# Patient Record
Sex: Female | Born: 1990 | Race: White | Hispanic: No | Marital: Single | Smoking: Never smoker
Health system: Southern US, Community
[De-identification: ages and names within clinical notes are randomized; demographics above are authoritative.]

## PROBLEM LIST (undated history)

## (undated) DIAGNOSIS — R519 Headache, unspecified: Secondary | ICD-10-CM

## (undated) DIAGNOSIS — R21 Rash and other nonspecific skin eruption: Secondary | ICD-10-CM

## (undated) DIAGNOSIS — F419 Anxiety disorder, unspecified: Secondary | ICD-10-CM

## (undated) DIAGNOSIS — R102 Pelvic and perineal pain: Secondary | ICD-10-CM

## (undated) DIAGNOSIS — T148XXA Other injury of unspecified body region, initial encounter: Secondary | ICD-10-CM

## (undated) DIAGNOSIS — I879 Disorder of vein, unspecified: Secondary | ICD-10-CM

## (undated) DIAGNOSIS — N83209 Unspecified ovarian cyst, unspecified side: Secondary | ICD-10-CM

## (undated) DIAGNOSIS — K219 Gastro-esophageal reflux disease without esophagitis: Secondary | ICD-10-CM

## (undated) DIAGNOSIS — H539 Unspecified visual disturbance: Secondary | ICD-10-CM

## (undated) DIAGNOSIS — F909 Attention-deficit hyperactivity disorder, unspecified type: Secondary | ICD-10-CM

## (undated) DIAGNOSIS — IMO0001 Reserved for inherently not codable concepts without codable children: Secondary | ICD-10-CM

## (undated) HISTORY — DX: Headache, unspecified: R51.9

## (undated) HISTORY — DX: Unspecified ovarian cyst, unspecified side: N83.209

## (undated) HISTORY — DX: Unspecified visual disturbance: H53.9

## (undated) HISTORY — DX: Rash and other nonspecific skin eruption: R21

## (undated) HISTORY — DX: Gastro-esophageal reflux disease without esophagitis: K21.9

## (undated) HISTORY — DX: Disorder of vein, unspecified: I87.9

## (undated) HISTORY — DX: Pelvic and perineal pain: R10.2

## (undated) HISTORY — DX: Other injury of unspecified body region, initial encounter: T14.8XXA

## (undated) HISTORY — DX: Attention-deficit hyperactivity disorder, unspecified type: F90.9

## (undated) HISTORY — DX: Anxiety disorder, unspecified: F41.9

## (undated) HISTORY — PX: WISDOM TOOTH EXTRACTION: SHX21

## (undated) HISTORY — PX: ENDOSCOPY UPPER GI: SHX510245

## (undated) HISTORY — PX: CERVICAL CONE BIOPSY: SUR198

## (undated) HISTORY — DX: Reserved for inherently not codable concepts without codable children: IMO0001

---
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
## 2012-01-25 ENCOUNTER — Observation Stay: Payer: Self-pay | Admitting: Internal Medicine

## 2012-01-25 LAB — COMPREHENSIVE METABOLIC PANEL
Albumin: 4.1 g/dL (ref 3.8–5.6)
Alkaline Phosphatase: 49 U/L — ABNORMAL LOW (ref 82–169)
Anion Gap: 7 (ref 7–16)
Bilirubin,Total: 0.6 mg/dL (ref 0.2–1.0)
Calcium, Total: 8.8 mg/dL — ABNORMAL LOW (ref 9.0–10.7)
Co2: 26 mmol/L (ref 21–32)
Glucose: 87 mg/dL (ref 65–99)
Osmolality: 275 (ref 275–301)
SGOT(AST): 23 U/L (ref 0–26)
SGPT (ALT): 17 U/L (ref 12–78)
Sodium: 139 mmol/L (ref 136–145)
Total Protein: 7.5 g/dL (ref 6.4–8.6)

## 2012-01-25 LAB — URINALYSIS, COMPLETE
Ketone: NEGATIVE
Leukocyte Esterase: NEGATIVE
Nitrite: NEGATIVE
Ph: 6 (ref 4.5–8.0)
Protein: NEGATIVE
RBC,UR: 3 /HPF (ref 0–5)
Squamous Epithelial: 13
WBC UR: 4 /HPF (ref 0–5)

## 2012-01-25 LAB — AMYLASE: Amylase: 22 U/L — ABNORMAL LOW (ref 25–115)

## 2012-01-25 LAB — CBC
HCT: 35.3 % (ref 35.0–47.0)
MCH: 30 pg (ref 26.0–34.0)
MCHC: 35.2 g/dL (ref 32.0–36.0)
MCV: 85 fL (ref 80–100)
Platelet: 304 10*3/uL (ref 150–440)
RDW: 12.9 % (ref 11.5–14.5)
WBC: 10.5 10*3/uL (ref 3.6–11.0)

## 2012-01-25 LAB — HEMOGLOBIN
HGB: 11.2 g/dL — ABNORMAL LOW (ref 12.0–16.0)
HGB: 11.8 g/dL — ABNORMAL LOW (ref 12.0–16.0)

## 2012-01-25 LAB — LIPASE, BLOOD: Lipase: 64 U/L — ABNORMAL LOW (ref 73–393)

## 2012-01-26 LAB — COMPREHENSIVE METABOLIC PANEL
Alkaline Phosphatase: 45 U/L — ABNORMAL LOW (ref 82–169)
Calcium, Total: 8.3 mg/dL — ABNORMAL LOW (ref 9.0–10.7)
Co2: 24 mmol/L (ref 21–32)
Creatinine: 0.74 mg/dL (ref 0.60–1.30)
EGFR (African American): >60
EGFR (Non-African Amer.): >60
Glucose: 71 mg/dL (ref 65–99)
SGOT(AST): 20 U/L (ref 0–26)
SGPT (ALT): 14 U/L (ref 12–78)
Sodium: 139 mmol/L (ref 136–145)

## 2012-01-26 LAB — CBC WITH DIFFERENTIAL/PLATELET
Eosinophil %: 3 %
HGB: 11.2 g/dL — ABNORMAL LOW (ref 12.0–16.0)
MCH: 30 pg (ref 26.0–34.0)
MCV: 85 fL (ref 80–100)
Monocyte %: 7.3 %
Neutrophil %: 58.8 %
Platelet: 230 10*3/uL (ref 150–440)
RBC: 3.74 10*6/uL — ABNORMAL LOW (ref 3.80–5.20)
RDW: 13 % (ref 11.5–14.5)
WBC: 9.6 10*3/uL (ref 3.6–11.0)

## 2013-04-07 ENCOUNTER — Emergency Department: Payer: Self-pay | Admitting: Emergency Medicine

## 2013-04-07 LAB — COMPREHENSIVE METABOLIC PANEL
ALBUMIN: 4 g/dL (ref 3.4–5.0)
ALT: 17 U/L (ref 12–78)
Alkaline Phosphatase: 52 U/L
Anion Gap: 4 — ABNORMAL LOW (ref 7–16)
BUN: 8 mg/dL (ref 7–18)
Bilirubin,Total: 0.7 mg/dL (ref 0.2–1.0)
CHLORIDE: 104 mmol/L (ref 98–107)
CO2: 28 mmol/L (ref 21–32)
Calcium, Total: 9.6 mg/dL (ref 8.5–10.1)
Creatinine: 0.65 mg/dL (ref 0.60–1.30)
EGFR (African American): >60
EGFR (Non-African Amer.): >60
Glucose: 82 mg/dL (ref 65–99)
Osmolality: 269 (ref 275–301)
Potassium: 3.8 mmol/L (ref 3.5–5.1)
SGOT(AST): 23 U/L (ref 15–37)
Sodium: 136 mmol/L (ref 136–145)
TOTAL PROTEIN: 7.8 g/dL (ref 6.4–8.2)

## 2013-04-07 LAB — CBC WITH DIFFERENTIAL/PLATELET
Basophil #: 0 10*3/uL (ref 0.0–0.1)
Basophil %: 0.6 %
EOS PCT: 0.8 %
Eosinophil #: 0.1 10*3/uL (ref 0.0–0.7)
HCT: 36.2 % (ref 35.0–47.0)
HGB: 12.4 g/dL (ref 12.0–16.0)
Lymphocyte #: 1.9 10*3/uL (ref 1.0–3.6)
Lymphocyte %: 24.1 %
MCH: 29.2 pg (ref 26.0–34.0)
MCHC: 34.3 g/dL (ref 32.0–36.0)
MCV: 85 fL (ref 80–100)
Monocyte #: 0.5 x10 3/mm (ref 0.2–0.9)
Monocyte %: 6.7 %
NEUTROS ABS: 5.4 10*3/uL (ref 1.4–6.5)
NEUTROS PCT: 67.8 %
Platelet: 280 10*3/uL (ref 150–440)
RBC: 4.25 10*6/uL (ref 3.80–5.20)
RDW: 15.8 % — ABNORMAL HIGH (ref 11.5–14.5)
WBC: 8 10*3/uL (ref 3.6–11.0)

## 2013-04-07 LAB — URINALYSIS, COMPLETE
BACTERIA: NONE SEEN
Bilirubin,UR: NEGATIVE
GLUCOSE, UR: NEGATIVE mg/dL (ref 0–75)
KETONE: NEGATIVE
LEUKOCYTE ESTERASE: NEGATIVE
NITRITE: NEGATIVE
Ph: 5 (ref 4.5–8.0)
Protein: NEGATIVE
Specific Gravity: 1.015 (ref 1.003–1.030)
Squamous Epithelial: 1
WBC UR: 1 /HPF (ref 0–5)

## 2013-04-07 LAB — HCG, QUANTITATIVE, PREGNANCY

## 2013-09-29 NOTE — H&P (Signed)
Madeline Anderson is an 23 y.o. 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  She also has a [place] IUD present.    No past medical history on file.    Allergies:   Allergies   Allergen Reactions   . Ciprofloxacin    . Shellfish-Derived Products        Active Problems:    * No active hospital problems. *    There were no vitals taken for this visit.    Review of Systems   Constitutional: Negative.    HENT: Negative.    Respiratory: Negative.    Cardiovascular: Negative.    Gastrointestinal: Negative.    Genitourinary: Negative.    Musculoskeletal: Negative.    Skin: Negative.    Neurological: Negative.    Endo/Heme/Allergies: Negative.    Psychiatric/Behavioral: Negative.        Physical Exam   Constitutional: She is oriented to person, place, and time.   HENT:   Head: Normocephalic and atraumatic.   Eyes: Pupils are equal, round, and reactive to light.   Neck: No thyromegaly present.   Cardiovascular: Normal rate, regular rhythm and normal heart sounds.    Pulmonary/Chest: Effort normal and breath sounds normal.   Abdominal: Soft. Bowel sounds are normal.   Genitourinary: Vagina normal and uterus normal.   Musculoskeletal: Normal range of motion.   Lymphadenopathy:     She has no cervical adenopathy.   Neurological: She is alert and oriented to person, place, and time.       Assessment:  CIN 2, IUD    Plan:  We plan to do a cervical cone biopsy via LEEP and an ECC.  I will have to remove the IUD and will send it for actinomyces culture.  She understands the procedure, risks, benefits and alternatives and verbal consent is obtained.    Demetra Shiner  09/29/2013

## 2013-09-30 ENCOUNTER — Ambulatory Visit (HOSPITAL_BASED_OUTPATIENT_CLINIC_OR_DEPARTMENT_OTHER): Payer: No Typology Code available for payment source | Admitting: Anesthesiology

## 2013-09-30 ENCOUNTER — Ambulatory Visit (HOSPITAL_BASED_OUTPATIENT_CLINIC_OR_DEPARTMENT_OTHER): Payer: No Typology Code available for payment source | Admitting: Gynecology

## 2013-09-30 ENCOUNTER — Ambulatory Visit: Payer: Self-pay

## 2013-09-30 ENCOUNTER — Ambulatory Visit
Admission: RE | Admit: 2013-09-30 | Discharge: 2013-09-30 | Disposition: A | Discharge status: Home / Self Care | Payer: No Typology Code available for payment source | Source: Ambulatory Visit | Attending: Gynecology | Admitting: Gynecology

## 2013-09-30 ENCOUNTER — Encounter (HOSPITAL_BASED_OUTPATIENT_CLINIC_OR_DEPARTMENT_OTHER)
Admission: RE | Disposition: A | Discharge status: Home / Self Care | Payer: Self-pay | Source: Ambulatory Visit | Attending: Gynecology

## 2013-09-30 ENCOUNTER — Encounter (HOSPITAL_BASED_OUTPATIENT_CLINIC_OR_DEPARTMENT_OTHER): Payer: Self-pay

## 2013-09-30 DIAGNOSIS — N871 Moderate cervical dysplasia: Secondary | ICD-10-CM | POA: Insufficient documentation

## 2013-09-30 DIAGNOSIS — Z30432 Encounter for removal of intrauterine contraceptive device: Secondary | ICD-10-CM | POA: Insufficient documentation

## 2013-09-30 HISTORY — PX: EXCISION, CERVIX LESION, LEEP PROCEDURE: SHX3954

## 2013-09-30 LAB — POCT PREGNANCY TEST, URINE HCG: POCT Pregnancy HCG Test, UR: NEGATIVE

## 2013-09-30 SURGERY — EXCISION, CERVIX LESION, LEEP PROCEDURE
Anesthesia: Anesthesia General | Site: Pelvis | Wound class: Clean Contaminated

## 2013-09-30 MED ORDER — LACTATED RINGERS IV SOLN
INTRAVENOUS | Status: DC
Start: 2013-09-30 — End: 2013-09-30

## 2013-09-30 MED ORDER — ONDANSETRON HCL 4 MG/2ML IJ SOLN
4.0000 mg | Freq: Once | INTRAMUSCULAR | Status: DC | PRN
Start: 2013-09-30 — End: 2013-09-30

## 2013-09-30 MED ORDER — EPHEDRINE SULFATE 50 MG/ML IJ SOLN
INTRAMUSCULAR | Status: DC | PRN
Start: 2013-09-30 — End: 2013-09-30
  Administered 2013-09-30: 12:00:00 10 mg via INTRAVENOUS

## 2013-09-30 MED ORDER — EPHEDRINE SULFATE 50 MG/ML IJ SOLN
INTRAMUSCULAR | Status: AC
Start: 2013-09-30 — End: ?
  Filled 2013-09-30: qty 1

## 2013-09-30 MED ORDER — HYDROMORPHONE HCL PF 1 MG/ML IJ SOLN
0.5000 mg | INTRAMUSCULAR | Status: DC | PRN
Start: 2013-09-30 — End: 2013-09-30

## 2013-09-30 MED ORDER — OXYCODONE-ACETAMINOPHEN 5-325 MG PO TABS
1.0000 | ORAL_TABLET | Freq: Once | ORAL | Status: AC | PRN
Start: 2013-09-30 — End: 2013-09-30
  Administered 2013-09-30: 1 via ORAL

## 2013-09-30 MED ORDER — LIDOCAINE HCL 2 % IJ SOLN
INTRAMUSCULAR | Status: DC | PRN
Start: 2013-09-30 — End: 2013-09-30
  Administered 2013-09-30: 50 mg

## 2013-09-30 MED ORDER — FENTANYL CITRATE 0.05 MG/ML IJ SOLN
INTRAMUSCULAR | Status: AC
Start: 2013-09-30 — End: ?
  Filled 2013-09-30: qty 2

## 2013-09-30 MED ORDER — ACETIC ACID 5% 30 ML VIAL
Status: DC | PRN
Start: 2013-09-30 — End: 2013-09-30
  Administered 2013-09-30: 30 mL

## 2013-09-30 MED ORDER — OXYCODONE-ACETAMINOPHEN 5-325 MG PO TABS
ORAL_TABLET | ORAL | Status: AC
Start: 2013-09-30 — End: ?
  Filled 2013-09-30: qty 1

## 2013-09-30 MED ORDER — IODINE STRONG 5 % PO SOLN
ORAL | Status: DC | PRN
Start: 2013-09-30 — End: 2013-09-30
  Administered 2013-09-30: 0.2 mL via TOPICAL

## 2013-09-30 MED ORDER — MIDAZOLAM HCL 2 MG/2ML IJ SOLN
INTRAMUSCULAR | Status: DC | PRN
Start: 2013-09-30 — End: 2013-09-30
  Administered 2013-09-30: 2 mg via INTRAVENOUS

## 2013-09-30 MED ORDER — ACETIC ACID 5% 30 ML VIAL
Status: DC
Start: 2013-09-30 — End: 2013-09-30
  Filled 2013-09-30: qty 30

## 2013-09-30 MED ORDER — IODINE STRONG 5 % PO SOLN
ORAL | Status: DC
Start: 2013-09-30 — End: 2013-09-30
  Filled 2013-09-30: qty 473

## 2013-09-30 MED ORDER — PROPOFOL INFUSION 10 MG/ML
INTRAVENOUS | Status: DC | PRN
Start: 2013-09-30 — End: 2013-09-30
  Administered 2013-09-30: 40 mg via INTRAVENOUS
  Administered 2013-09-30: 10 mg via INTRAVENOUS

## 2013-09-30 MED ORDER — FENTANYL CITRATE 0.05 MG/ML IJ SOLN
INTRAMUSCULAR | Status: DC | PRN
Start: 2013-09-30 — End: 2013-09-30
  Administered 2013-09-30 (×3): 25 ug via INTRAVENOUS

## 2013-09-30 MED ORDER — METOCLOPRAMIDE HCL 5 MG/ML IJ SOLN
10.0000 mg | Freq: Once | INTRAMUSCULAR | Status: DC | PRN
Start: 2013-09-30 — End: 2013-09-30

## 2013-09-30 MED ORDER — FERRIC SUBSULFATE 259 MG/GM EX SOLN
CUTANEOUS | Status: DC
Start: 2013-09-30 — End: 2013-09-30
  Filled 2013-09-30: qty 8000

## 2013-09-30 MED ORDER — PROPOFOL 10 MG/ML IV EMUL
INTRAVENOUS | Status: AC
Start: 2013-09-30 — End: ?
  Filled 2013-09-30: qty 50

## 2013-09-30 MED ORDER — SODIUM CHLORIDE 0.9 % IR SOLN
Status: DC | PRN
Start: 2013-09-30 — End: 2013-09-30
  Administered 2013-09-30: 1000 mL

## 2013-09-30 MED ORDER — FERRIC SUBSULFATE 259 MG/GM EX SOLN
CUTANEOUS | Status: DC | PRN
Start: 2013-09-30 — End: 2013-09-30
  Administered 2013-09-30: 8 g via TOPICAL

## 2013-09-30 MED ORDER — MIDAZOLAM HCL 2 MG/2ML IJ SOLN
INTRAMUSCULAR | Status: AC
Start: 2013-09-30 — End: ?
  Filled 2013-09-30: qty 2

## 2013-09-30 MED ORDER — FENTANYL CITRATE 0.05 MG/ML IJ SOLN
25.0000 ug | INTRAMUSCULAR | Status: DC | PRN
Start: 2013-09-30 — End: 2013-09-30

## 2013-09-30 MED ORDER — PROPOFOL INFUSION 10 MG/ML
INTRAVENOUS | Status: DC | PRN
Start: 2013-09-30 — End: 2013-09-30
  Administered 2013-09-30: 120 ug/kg/min via INTRAVENOUS

## 2013-09-30 SURGICAL SUPPLY — 18 items
DEPRESSOR TONGUE JR BIRCHWOOD (Procedure Accessories) IMPLANT
ELECTRODE LLETZ BALL 5MM (Cautery) ×1 IMPLANT
ELECTRODE LLETZ LOOP 10 X 10MM (Cautery) ×1 IMPLANT
ELECTRODE LLETZ LOOP 15 X 12MM (Cautery) IMPLANT
ELECTRODE LLETZ LOOP 20 X 15MM (Cautery) IMPLANT
GLOVE SRG NTR RBR 7 BGL IND 288X91MM LTX (Glove) ×1
GLOVE SURG BIOGEL INDIC SZ 7.0 (Glove) ×1
GLOVE SURG BIOGEL PF LTX SZ7.0 (Glove) ×2 IMPLANT
GLOVE SURGICAL 7 BIOGEL INDICATOR POWDER (Glove) ×1 IMPLANT
GLOVE SURGICAL 7 BIOGEL INDICATOR POWDER FREE SMOOTH BEAD CUFF (Glove) ×1 IMPLANT
HOSE EVAC SMOKE 7/8INX6FT (Suction) ×2 IMPLANT
INACTIVE USE LAWSON 93583 (Gown) ×2 IMPLANT
PACK LITHOTOMY MINOR (Pack) ×2 IMPLANT
PAD ELECTROSRG GRND REM W CRD (Procedure Accessories) ×2 IMPLANT
PAD SANITARY L12.25 IN X W4.25 IN HEAVY ABSORBENT MOISTURE BARRIER (Dressing) IMPLANT
PAD SNTR SLK FLF CRTY 12.25X4.25IN LF NS (Dressing) ×1 IMPLANT
PAD-PERI SANITARY REGULAR (Dressing) ×1
PENCIL ELECTRO PUSH BUTTON (Cautery) ×2 IMPLANT

## 2013-09-30 NOTE — Brief Op Note (Signed)
{  BRIEF OP NOTE CHOICES:2BRIEF OP NOTE    Date Time: 09/30/2013 11:51 AM    Patient Name:   Madeline Anderson    Date of Operation:   09/30/2013    Providers Performing:   Surgeon(s):  Demetra Shiner, MD    Assistant (s):   Scrub Person: Dioquino, Ardyth Gal, RN  Preceptor: Quincy Simmonds, RN; Toya Smothers, RN    Operative Procedure:   Procedure(s):  LEEP & ECC    Preoperative Diagnosis:   Pre-Op Diagnosis Codes:     * Moderate dysplasia of cervix [622.12]    Postoperative Diagnosis:   Post-Op Diagnosis Codes:     * Moderate dysplasia of cervix [622.12]    Anesthesia:   IV Sedation    Estimated Blood Loss:    * No values recorded between 09/30/2013 11:42 AM and 09/30/2013 11:51 AM *    Implants:   * No implants in log *    Drains:   Drains: no    Specimens:       Findings:   Small area of non staining    Complications:   None      Signed by: Demetra Shiner, MD                                                                           [facility]

## 2013-09-30 NOTE — Interval H&P Note (Signed)
No change since dictation.    Madeline Sacramento, MD

## 2013-09-30 NOTE — Anesthesia Preprocedure Evaluation (Signed)
Anesthesia Evaluation    AIRWAY    Mallampati: II    TM distance: <3 FB       CARDIOVASCULAR    cardiovascular exam normal       DENTAL         PULMONARY    pulmonary exam normal     OTHER FINDINGS                      Anesthesia Plan    ASA 2     general                     intravenous induction   Detailed anesthesia plan: general IV        Post op pain management: per surgeon        Plan discussed with CRNA.

## 2013-09-30 NOTE — Transfer of Care (Signed)
Anesthesia Transfer of Care Note    Patient: Madeline Anderson    Procedures performed: Procedure(s):  LEEP & ECC    Anesthesia type: General TIVA    Patient location:Gyn PACU    Last vitals:   Filed Vitals:    09/30/13 1159   BP: 103/59   Pulse: 72   Temp: 36.6 C (97.9 F)   Resp: 12   SpO2: 100%       Post pain: Patient not complaining of pain, continue current therapy      Mental Status:awake    Respiratory Function: tolerating room air    Cardiovascular: stable    Nausea/Vomiting: patient not complaining of nausea or vomiting    Hydration Status: adequate    Post assessment: no apparent anesthetic complications and no reportable events

## 2013-09-30 NOTE — Anesthesia Postprocedure Evaluation (Signed)
Anesthesia Post Evaluation    Patient: Madeline Anderson    Procedures performed: Procedure(s):  LEEP & ECC    Anesthesia type: General TIVA    Patient location:Phase II PACU    Last vitals:   Filed Vitals:    09/30/13 1240   BP: 108/60   Pulse: 69   Temp: 36.7 C (98 F)   Resp: 12   SpO2: 97%       Post pain: Patient not complaining of pain, continue current therapy      Mental Status:awake    Respiratory Function: tolerating room air    Cardiovascular: stable    Nausea/Vomiting: patient not complaining of nausea or vomiting    Hydration Status: adequate    Post assessment: no apparent anesthetic complications

## 2013-09-30 NOTE — Discharge Instructions (Signed)
LEEP  LEEP stands for loop electrosurgical excision procedure. It is used to treat dysplasia (abnormal cell growth). A fine wire loop is used to remove a small amount of tissue from your cervix. This can be done in the doctor's office. You can go back to your routine the same day. Schedule your LEEP for a time when you are not menstruating.  During the Procedure  You'll place your feet in stirrups. Your doctor then inserts a speculum into your vagina. The speculum holds the walls of the vagina open to let the doctor see the cervix:   Your cervix is numbed with a local anesthetic.   A mild vinegar or iodine solution is applied to your cervix. This helps to highlight any dysplasia.   Your doctor may look through a colposcope. This helps him or her to get a close-up view of your cervix.   The loop is inserted through your vagina and moved toward the cervix.   The loop is used to remove a small piece of cervical tissue.   A medicated solution may be applied to the cervix. This helps reduce bleeding.     The loop is inserted.       Tissue is removed from the cervix.       Medication is applied to reduce bleeding.      After the Procedure  You will be able to lie on the table until you are ready to stand up. You can then go back to your normal routine. The solution may cause a dark vaginal discharge for a few days after the procedure. Your cervix should heal completely within a few weeks.  When to Call Your Doctor  Call your doctor right away if you have any of the following:   Heavy bleeding or bleeding with clots   Severe abdominal pain   Fever   Foul-smelling discharge    2000-2014 [employer] [address], [place] [address]. All rights reserved. This information is not intended as a substitute for professional medical care. Always follow your healthcare professional's instructions.

## 2013-10-01 ENCOUNTER — Encounter (HOSPITAL_BASED_OUTPATIENT_CLINIC_OR_DEPARTMENT_OTHER): Payer: Self-pay | Admitting: Gynecology

## 2013-10-02 LAB — LAB USE ONLY - HISTORICAL SURGICAL PATHOLOGY

## 2013-10-06 NOTE — Op Note (Signed)
Procedure Date: 09/30/2013     Patient Type: A     SURGEON: Demetra Shiner MD  ASSISTANT:       PREOPERATIVE DIAGNOSIS:  Cervical intraepithelial neoplasia II and presence of Skyla intrauterine  device.     POSTOPERATIVE DIAGNOSIS:   Same     TITLE OF PROCEDURE:  Removal of the intrauterine device and cervical cone biopsy via LEEP and  endocervical curettage.       ANESTHESIA:   IV sedation.     FLUIDS:  The patient received a liter of fluid.     ESTIMATED BLOOD LOSS:  Minimal.     DESCRIPTION OF PROCEDURE:  The patient was taken to the operating room, placed in the dorsal lithotomy  position, and no preparation was performed.  She was draped with sterile  drapes, and a plastic-coated speculum was placed in the patient's vagina  and the cervix visualized.  The strings of the IUD were visible, and the  IUD was removed and submitted for actinomyces culture.  The cervix was then  cleaned with dilute acetic acid solution to dissolve away the cervical  mucous and then stained with Lugol's.  There was a small area of  nonstaining around the cervical os, and using a small wire loop, a cone was  performed without difficulty and submitted to pathology.  The entire  transformation zone had been removed, and then an ECC was performed above  the cone.  Hemostasis was obtained using a large ball electrode, and the  power settings were 50/50 blend 1.  Hemostasis was obtained and the base of  the cone swabbed with Monsel's paste, and the instruments were then removed  from the patient's vagina.  The final sponge and instrument counts were  taken and found to be correct, and she was returned to the postoperative  recovery room with an IV running.  There were no complications.           D:  10/06/2013 09:08 AM by Dr. Demetra Shiner, MD [phone])  T:  10/06/2013 18:33 PM by [place]      Everlean Cherry: [id number]) (Doc ID: [id number])

## 2013-11-12 ENCOUNTER — Emergency Department: Payer: Self-pay | Admitting: Emergency Medicine

## 2014-05-07 ENCOUNTER — Encounter: Payer: Self-pay | Admitting: Podiatry

## 2014-05-07 ENCOUNTER — Ambulatory Visit (INDEPENDENT_AMBULATORY_CARE_PROVIDER_SITE_OTHER): Payer: 59 | Admitting: Podiatry

## 2014-05-07 ENCOUNTER — Ambulatory Visit (INDEPENDENT_AMBULATORY_CARE_PROVIDER_SITE_OTHER): Payer: Commercial Managed Care - PPO

## 2014-05-07 VITALS — BP 108/81 | HR 71 | Resp 16 | Ht 67.0 in | Wt 117.0 lb

## 2014-05-07 DIAGNOSIS — M6740 Ganglion, unspecified site: Secondary | ICD-10-CM

## 2014-05-07 NOTE — Progress Notes (Signed)
   Subjective:    Patient ID: Felicia Knight, female    DOB: January 05, 1991, 24 y.o.   MRN: [id number]  HPI  24 year old female presents the office they with complaints of painful cyst on top of her right foot which has been ongoing for approximate 6 days his been progressively getting larger. She states that she has pain particularly was shoes as it causes pressure overlying the area. She states that the mass is soft. She stated the proximal A1 month ago she had an injury in which someone stepped on her foot. After that time she had some bruising to the foot and along the same area however she did not seek treatment and the pain resolved on its own. No other complaints at this time.  Review of Systems  All other systems reviewed and are negative.      Objective:   Physical Exam AAO x3, NAD DP/PT pulses palpable bilaterally, CRT less than 3 seconds Protective sensation intact with Simms Weinstein monofilament, vibratory sensation intact, Achilles tendon reflex intact On the dorsal aspect of the right foot on the third metatarsal area there is a small 1 x 1 cm fluid filled encapsulated soft tissue mass with well-defined borders. There is no overlying skin changes. There is tenderness upon direct palpation of this mass. There is no other areas of specific pinpoint bony tenderness or pain with vibratory sensation. No overlying edema, erythema, increase in warmth. No other lesions are identified bilaterally. MMT 5/5, ROM WNL No open lesions or pre-ulcerative lesions are identified. No pain with calf compression, swelling, warmth, erythema.        Assessment & Plan:  24 year old female right foot ganglion cyst -X-rays were obtained and reviewed with the patient. -Treatment options were discussed include alternatives, risks, complications. -Discussed aspiration of the cyst for which she like to proceed. Risks and complications were discussed and she understands and verbally consents. Under  sterile conditions a total of 1 mL mixture of 2% lidocaine plain and 0.5% Marcaine plain was infiltrated in a regional block fashion around the soft tissue mass. Once anesthetized the skin was prepped in a sterile fashion. An 18-gauge needle was utilized to aspirate the soft tissue mass and cleared gel-like fluid was aspirated. After aspiration the soft tissue mass was significantly improved in size. 0.5 mL of dexamethasone phosphate was infiltrated into the symptomatic area. A Band-Aid was applied followed by a Compression dressing. Post procedure care was discussed with the patient. -Follow-up in 2 weeks or sooner if any palms are to arise. In the meantime occurs call the office with any questions, concerns, changes symptoms.

## 2014-05-21 ENCOUNTER — Ambulatory Visit: Payer: Commercial Managed Care - PPO | Admitting: Podiatry

## 2014-06-09 NOTE — Consult Note (Signed)
Pt with epigastric, lower substernal discomfort, no vomiting in 2 days.  EGD with gastritis. Wants to go home with mom who is a Armed forces operational officerdermatologist near [place].  Discussed med treatment with PPI and carafate, await bx for H pylori.  I talked to hospitalist who will see patient and likely discharge.  Electronic Signatures: Scot JunElliott, Jonah Nestle T (MD)  (Signed on 07-Dec-13 10:03)  Authored  Last Updated: 07-Dec-13 10:03 by Scot JunElliott, Makyia Erxleben T (MD)

## 2014-06-09 NOTE — Consult Note (Signed)
Chief Complaint:   Subjective/Chief Complaint EGd unremarkable except mild gastritis. Patient vomited again after meal this afternoon and c/o epigastric pain again. No hemetemesis.   VITAL SIGNS/ANCILLARY NOTES: **Vital Signs.:   05-Dec-13 15:51   Temperature Temperature (F) 98.1   Celsius 36.7   Temperature Source oral   Pulse Pulse 62   Respirations Respirations 18   Systolic BP Systolic BP 110   Diastolic BP (mmHg) Diastolic BP (mmHg) 73   Mean BP 85   Pulse Ox % Pulse Ox % 97   Pulse Ox Activity Level  At rest   Oxygen Delivery Room Air/ 21 %   Assessment/Plan:  Assessment/Plan:   Assessment [place], vomiting and abdominal pain. EGD unremarkable as well as lipase. No hematemesis.    Plan Will obtain Amylase. Change to clear liquids. US abdomen in am. Further recommendations to follow.   Electronic Signatures: Lurline DelIftikhar, Mahreen Schewe (MD)  (Signed 05-Dec-13 18:56)  Authored: Chief Complaint, VITAL SIGNS/ANCILLARY NOTES, Assessment/Plan   Last Updated: 05-Dec-13 18:56 by Lurline DelIftikhar, Tennie Grussing (MD)

## 2014-06-09 NOTE — Consult Note (Signed)
PATIENT NAME:  Felicia Knight, Felicia Knight MR#:  [id number] DATE OF BIRTH:  1990-09-14  DATE OF CONSULTATION:  01/25/2012  REFERRING PHYSICIAN:   CONSULTING PHYSICIAN:  Lurline DelShaukat Trace Cederberg, MD  REASON FOR CONSULTATION:  Abdominal pain, vomiting, and coffee-ground emesis.  HISTORY OF PRESENT ILLNESS: 24 year old female without significant past medical history. The patient came to the Emergency Room today. Apparently the patient has been having problems with vomiting, usually after meals, for the last two weeks. The vomiting has become more severe within the last few days along with epigastric pain. Yesterday she noticed coffee-ground material on 2 to 3 occasions in the vomitus. She got quite concerned and came to the Emergency Room and was admitted early this morning. Apparently she has been using Nexium, Pepcid, ranitidine, and TUMS as an outpatient without significant relief. She [place] an [place]. She denies any lower GI symptoms such as rectal bleeding. She had a normal bowel movement and according to her it was not dark or bloody. When evaluated this morning she was also complaining of burning retrosternal chest pain along with some epigastric discomfort. No fever or chills. Denies any other significant problems and no history of nonsteroidal use.   PAST MEDICAL HISTORY: None.   FAMILY HISTORY: Unremarkable.   ALLERGIES: Shellfish. The patient is on a gluten-free diet according to her, although not sure if the diagnosis of celiac disease has ever been confirmed or not.   HOME MEDICATIONS: Birth control pills, Adderall, and Nexium and TUMS as mentioned above.   REVIEW OF SYSTEMS: Negative except for what is mentioned in the History of Present Illness.   PHYSICAL EXAMINATION:  GENERAL: Well built female, does not appear to be in distress. Does not appear to be toxic or septic.   VITAL SIGNS: Temperature 98.1, pulse 62, respirations 18, blood  pressure 110/73.   HEENT: Unremarkable. No jaundice was noted.   NECK: Veins are flat.   LUNGS: Clear to auscultation bilaterally.   CARDIOVASCULAR: Regular rate and rhythm. No gallops or murmur.   ABDOMEN: Normal bowel sounds. Minimal epigastric tenderness. There is no rebound or guarding. No ascites. No hepatosplenomegaly.   NEUROLOGIC: Examination appears to be unremarkable.   LABORATORY, DIAGNOSTIC, AND RADIOLOGICAL DATA: Serum lipase is normal at 64. Liver enzymes are normal as well as BUN and creatinine. Hemoglobin was 12.4 on admission, 11.2 a few hours after hydration. Urinalysis is unremarkable. Pregnancy test is negative. Chest and abdominal x-rays are unremarkable.   ASSESSMENT AND PLAN:  Patient with nausea, vomiting, retrosternal chest pain, epigastric pain, along with some coffee-ground emesis. The coffee-ground emesis may be secondary to gastritis or a Mallory-Weiss tear due to intermittent vomiting for a couple of weeks. Most likely she is suffering from an acid peptic disorder and therefore it was decided to perform an upper GI endoscopy. Upper GI endoscopy was done this afternoon which showed only minimal gastritis. No blood or bleeding was noted. No other lesions were noted. The patient continues to be symptomatic and other possibilities include gallbladder disease such as gallstones. Pancreatitis is less likely as serum lipase was normal. I will go ahead and obtain serum amylase. We will order an ultrasound for tomorrow morning. Continue on IV Protonix as well as Zofran and Mylanta for symptomatic relief.  The plan was discussed earlier with the patient's mother as well.  We will follow.   ____________________________ Lurline DelShaukat Martasia Talamante, MD si:bjt D: 01/25/2012 19:03:28 ET T: 01/26/2012 07:47:31 ET JOB#: [id number] Wynelle LinkSHAUKAT [id number]  MD ELECTRONICALLY SIGNED 01/29/2012 10:49

## 2014-06-09 NOTE — Consult Note (Signed)
Chief Complaint:   Subjective/Chief Complaint Still with abdominal pain. Seems to be tolerating liquids. No vomiting today. No hematemesis or melena.   VITAL SIGNS/ANCILLARY NOTES: **Vital Signs.:   06-Dec-13 05:08   Vital Signs Type Routine   Temperature Temperature (F) 97.9   Celsius 36.6   Temperature Source oral   Pulse Pulse 65   Respirations Respirations 18   Systolic BP Systolic BP 94   Diastolic BP (mmHg) Diastolic BP (mmHg) 57   Mean BP 69   Pulse Ox % Pulse Ox % 98   Pulse Ox Activity Level  At rest   Oxygen Delivery Room Air/ 21 %   Brief Assessment:   Additional Physical Exam Mild epigastric tenderness. Abdominal examination is otherwise benign.   Lab Results: Hepatic:  06-Dec-13 04:33    Bilirubin, Total 0.7   Alkaline Phosphatase  45   SGPT (ALT) 14   SGOT (AST) 20   Total Protein, Serum  5.8   Albumin, Serum  3.2  Routine Chem:  06-Dec-13 04:33    Glucose, Serum 71   BUN  6   Creatinine (comp) 0.74   Sodium, Serum 139   Potassium, Serum 4.0   Chloride, Serum 107   CO2, Serum 24   Calcium (Total), Serum  8.3   Osmolality (calc) 274   eGFR (African American) >60   eGFR (Non-African American) >60 (eGFR values <59mL/min/1.73 m2 may be an indication of chronic kidney disease (CKD). Calculated eGFR is useful in patients with stable renal function. The eGFR calculation will not be reliable in acutely ill patients when serum creatinine is changing rapidly. It is not useful in  patients on dialysis. The eGFR calculation may not be applicable to patients at the low and high extremes of body sizes, pregnant women, and vegetarians.)   Anion Gap 8  Routine Hem:  06-Dec-13 04:33    WBC (CBC) 9.6   RBC (CBC)  3.74   Hemoglobin (CBC)  11.2   Hematocrit (CBC)  31.7   Platelet Count (CBC) 230   MCV 85   MCH 30.0   MCHC 35.5   RDW 13.0   Neutrophil % 58.8   Lymphocyte % 30.5   Monocyte % 7.3   Eosinophil % 3.0   Basophil % 0.4   Neutrophil # 5.7    Lymphocyte # 2.9   Monocyte # 0.7   Eosinophil # 0.3   Basophil # 0.0 (Result(s) reported on 26 Jan 2012 at 05:41AM.)   Assessment/Plan:  Assessment/Plan:   Assessment Patient with nausea/ vomiting and ? coffee ground emesis along with abdominal pain. Amylase and lipase are normal as well as [place]. EGD showed minimal gastritis only. H and H stable and vitals are fine without tachycardia etc. ? functional due to high level of stress.    Plan Continue full liquid diet. Advance as tolerated. Continue PPI and Carafate. Dr. Vira Agar will follow her over the weekend.   Electronic Signatures: Jill Side (MD)  (Signed 06-Dec-13 12:54)  Authored: Chief Complaint, VITAL SIGNS/ANCILLARY NOTES, Brief Assessment, Lab Results, Assessment/Plan   Last Updated: 06-Dec-13 12:54 by Jill Side (MD)

## 2014-06-09 NOTE — H&P (Signed)
PATIENT NAME:  Myrtie CruiseNWYLL, Darion L MR#:  [id number] DATE OF BIRTH:  1991-01-11  DATE OF ADMISSION:  01/25/2012  REFERRING PHYSICIAN: Lucrezia EuropeAllison Webster, MD  PRIMARY CARE PHYSICIAN: No local physician ([place] in [place])  CHIEF COMPLAINT: Epigastric pain and coffee-ground emesis.   HISTORY OF PRESENT ILLNESS: This is a 24 year old female without significant past medical history who presents with complaints of coffee-ground emesis. The patient reports she started to have intermittent epigastric pain over the last two weeks, became more significant over the last week. As well she was having vomiting over the last week, but reports it was nonbloody and basically reports it was what she ate, and she reports over the night she started to develop some coffee-ground emesis, had a total of three episodes. Last one was the most significant, which was 40 minutes before presentation to the ED. So far the patient has had no episodes of coffee-ground emesis in the emergency department. As well she has been complaining of epigastric pain. The patient denies any recent use of any NSAIDs or any heavy use of alcohol. As well, denies any melena or bright red blood per rectum, no diarrhea and no constipation. The patient reports she has an appointment scheduled with a GI physician in the [place] area on 11/16 of this month. Reports she has been taking Nexium, Pepcid, ranitidine, and TUMS without much improvement of her symptoms. Reports she is having mid term exams and she is under a lot of stress recently. She denies any coffee use as well. The patient's hemoglobin was 12.4 in the ED; there is no baseline to compare. Hospitalist service was requested to admit the patient for further management and work-up of her symptoms.   PAST MEDICAL HISTORY: None.   SOCIAL HISTORY: Denies any smoking. Drinks alcohol only occasionally, but no drinking over the last two weeks. No illicit drug use. 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   FAMILY HISTORY: She denies any history of gastric ulcer.   ALLERGIES: She reports allergy to shellfish.    HOME MEDICATIONS:  1. Ortho Tri-Cyclen-Lo oral tablet. 2. Adderall XR 20 mg oral daily, extended release.  3. Over-the-counter Nexium, TUMS, ranitidine, and Peptid.  REVIEW OF SYSTEMS: CONSTITUTIONAL: Denies any fever, fatigue, or weakness. EYES: Denies blurry vision, double vision, or pain. No redness. RESPIRATORY: Denies any cough, wheezing, hemoptysis, or dyspnea. ENT: Denies tinnitus, ear pain, hearing loss, or epistaxis. CARDIOVASCULAR: Denies any chest pain, orthopnea, edema, arrhythmias, palpitations, or syncope. GASTROINTESTINAL: Denies nausea, diarrhea, and constipation. Has complaint of epigastric abdominal pain. Has coffee-ground emesis. Has complaints of vomiting. Has complaints of hematemesis. GENITOURINARY: Denies dysuria, hematuria, or renal colic. ENDOCRINE: Denies polyuria, polydipsia, heat or cold intolerance. INTEGUMENTARY: Denies acne, rash, or skin lesions. HEMATOLOGY: Denies anemia, easy bruising, or history of blood clots. MUSCULOSKELETAL: Denies neck pain, shoulder pain, knee pain, or arthritis. NEUROLOGIC: Denies numbness, weakness, dysarthria, epilepsy, tremors, or vertigo. PSYCH: Denies anxiety, schizophrenia, nervousness, or insomnia.   PHYSICAL EXAMINATION:   VITAL SIGNS: Temperature 98.3, pulse 71, respiratory rate 16, blood pressure 111/71, and saturating 99% on room air.   GENERAL: Young female who lies comfortable in bed, in no apparent distress, well-nourished.  HEENT: Head atraumatic, normocephalic. Pupils equal and reactive to light. Pink conjunctivae. Anicteric sclerae. Moist oral mucosa.   NECK: Supple. No thyromegaly. No JVD.   CHEST: Good air entry bilaterally. No wheezing, rales, or rhonchi.   CARDIOVASCULAR: S1 and S2 heard. No rubs, murmurs, or gallops.  ABDOMEN: Has some epigastric tenderness, but no rebound  and no guarding. Bowel sounds present. No organomegaly.   EXTREMITIES: No edema. No clubbing. No cyanosis.   PSYCHIATRIC: Appropriate affect. Awake and alert x3. Intact judgment and insight.   SKIN: Normal skin turgor. Warm and dry.   NEURO: Cranial nerves II through XII grossly intact. Motor five out of five.   PERTINENT LABS: Glucose 87, BUN 7, creatinine 0.64, sodium 139, potassium 3.7, chloride 106, CO2 26, total protein 7.5, albumin 4.1, total bilirubin 0.6, alkaline phosphatase 49, AST 23, and ALT 17. White blood cells 10.5, hemoglobin 12.4, hematocrit 35.3, and platelets 304.   Urinalysis is showing leukocyte esterase and nitrite negative.   ASSESSMENT AND PLAN: This is a 24 year old female who presents with coffee-ground emesis with epigastric tenderness. The patient has been having vomiting with epigastric tenderness for the last week.  1. Coffee-ground emesis. The patient had some epigastric tenderness. Initially started as vomiting and over the last 24 hours developed coffee-ground emesis. Etiology most likely is related to gastritis versus gastric ulcer and to a much lesser degree possible Mallory-Weiss tear from her previous episodes vomiting. The patient will be started on Protonix drip. She will be kept n.p.o. I discussed with Dr. Lurline Del, in gastroenterology consultation, who very likely is planning to her EGD done for her today. We will continue to monitor her hemoglobin every eight hours. As well, we will have type and screen done with her next labs as well.  2. Deep vein thrombosis prophylaxis. We will have the patient on sequential compression device. We will avoid chemical anticoagulation secondary to her coffee-ground emesis.   CODE STATUS: FULL CODE.   TOTAL TIME SPENT ON ADMISSION AND PATIENT CARE: 45 minutes. ____________________________ Starleen Arms, MD dse:slb D: 01/25/2012 08:36:37 ET     T: 01/25/2012 09:43:50 ET        JOB#: [id number] cc: Starleen Arms, MD, <Dictator> Asani Mcburney Teena Irani MD ELECTRONICALLY SIGNED 01/29/2012 0:29

## 2014-06-09 NOTE — Discharge Summary (Signed)
PATIENT NAME:  Felicia CruiseNWYLL, Shefali L MR#:  [id number] DATE OF BIRTH:  1990-03-21  DATE OF ADMISSION:  01/25/2012 DATE OF DISCHARGE:  01/27/2012  DISCHARGE DIAGNOSES:  1. Gastritis.  2. Acute blood loss anemia.  3. Dehydration.   IMAGING STUDIES: Ultrasound of the abdomen shows no cholelithiasis or acute cholecystitis.   PROCEDURES: EGD showed gastritis, no ulcers.   ADMITTING HISTORY AND PHYSICAL: Please see detailed History and Physical dictated on 01/25/2012. In brief, this is a 24 year old female patient with no significant past medical history who presented to the Emergency Room complaining of epigastric pain, nausea, and vomiting. The patient also had hematemesis and was admitted to the hospitalist service for further work-up and treatment.   HOSPITAL COURSE:  Gastritis: The patient had an EGD done which showed gastritis with no bleeding. The patient's hemoglobin stayed normal during the hospital stay. Ultrasound of the abdomen showed no gallbladder dysfunction or stones. The patient on the day of discharge is tolerating her food well, has minimal abdominal pain. Case was discussed with her mom and the patient is being discharged to follow up with her GI doctor out of town. The patient will be on Protonix, sucralfate, and Zofran for symptomatic nausea.   On the day of discharge, the patient is afebrile, pulse 59, tolerating food, and is being discharged home in fair condition.   DISCHARGE MEDICATIONS:  1. Protonix 40 mg oral twice a day.  2. Sucralfate 1 gram 3 times a day at bedtime.  3. Zofran 4 mg oral 4 times a day as needed for nausea.   DISCHARGE INSTRUCTIONS:  No NSAIDs, alcohol, smoking, or aspirin. The patient is to be compliant with her medications. She [place] in [place].   This case was discussed with the patient and her mom at bedside who verbalized understanding and are okay with the plan.   TIME SPENT: Time spent today on the  day of discharge and discharge activity was 35 minutes.    ____________________________ Molinda BailiffSrikar R. Gertrude Bucks, MD srs:bjt D: 01/27/2012 12:01:31 ET T: 01/27/2012 12:39:30 ET JOB#: [id number]  cc: Wardell HeathSrikar R. Yekaterina Escutia, MD, <Dictator> Orie FishermanSRIKAR R Burke Terry MD ELECTRONICALLY SIGNED 02/02/2012 11:08

## 2015-03-26 ENCOUNTER — Encounter (HOSPITAL_BASED_OUTPATIENT_CLINIC_OR_DEPARTMENT_OTHER): Payer: Self-pay

## 2015-03-26 ENCOUNTER — Ambulatory Visit: Payer: No Typology Code available for payment source

## 2015-03-26 NOTE — Pre-Procedure Instructions (Addendum)
   03/26/15 Consent reviewed in Epic.   No preoperative tests ordered.   History of Syncope in 2011 negative Cardiac Work Up. Patient does not remember where she went. Results requested from Dr. Phillips Odor office by fax.

## 2015-04-01 NOTE — Pre-Procedure Instructions (Addendum)
   Called and spoke w/Jennifer at Dr Merlino's office. No EKG done as far back as 2013.   Pt w/hx syncope Email to Nav 1 re need for anes review?. Pt had surgery 09/2013    Addendum: 1516     Follow up w/Nav 1.  anes rev not req at this time.  Pt had surgery 09/2013, no syncopal event per record since 2011

## 2015-04-05 NOTE — H&P (Signed)
ADMISSION HISTORY AND PHYSICAL EXAM    Date Time: 04/05/2015 1:39 PM  Patient Name: Madeline Anderson  Attending Physician: Demetra Shiner, MD    Assessment:   Chronic pelvic pain    Plan:    Laparoscopy and possible CO2 laser    History of Present Illness:   Madeline Anderson is a 25 y.o. female who presents to the hospital with  Chronic mild pelvic pain that sometimes is more severe. It is unchanged with birth control pills and made worse with an IUD.  Her last pelvic ultrasound was completely normal with no free fluid and no ovarian cysts. I suspect she may have endometriosis and we will use the CO2 laser if needed.   Past Medical History:     Past Medical History   Diagnosis Date   . Ovarian cyst    . ADHD (attention deficit hyperactivity disorder)    . Pelvic pain in female    . Gastroesophageal reflux disease      Diet Control   . Fracture      Right Ankle   . Anxiety    . H/O syncope 2011     Negative Cardiac Workup       Past Surgical History:     Past Surgical History   Procedure Laterality Date   . Endoscopy upper gi  20015   . Excision, cervix lesion, leep procedure N/A 09/30/2013     Procedure: LEEP & ECC;  Surgeon: Demetra Shiner, MD;  Location: [facility] OR;  Service: Gynecology;  Laterality: N/A;   . Wisdom tooth extraction  20       Family History:   History reviewed. No pertinent family history.    Social History:     Social History     Social History   . Marital Status: Single     Spouse Name: N/A   . Number of Children: N/A   . Years of Education: N/A     Social History Main Topics   . Smoking status: Never Smoker    . Smokeless tobacco: Never Used   . Alcohol Use: 1.8 - 3.6 oz/week     3-6 Shots of liquor per week   . Drug Use: No   . Sexual Activity: Not on file     Other Topics Concern   . Not on file     Social History Narrative       Allergies:     Allergies   Allergen Reactions   . Ciprofloxacin Other (See Comments)     Upset Stomach   . Shellfish-Derived Products Hives       Medications:     Nexium 40 mg, Celexa, Xanax as needed and Ortho Cyclen 1/35. Also Adderall    Review of Systems:   A comprehensive review of systems was:  Positive for chronic lower abdominal pain usually the right greater than the left.    Physical Exam:    BP 102/68  WT 119  HT 65.5 inches    HEENT exam is normal.   Lungs: Clear   CV: Regular rhythm normal S1 and S2   Abdomen: Soft and nontender to palpation.   Pelvic: EXT, BUS, vagina all normal. Cervix: Nulliparous. Uterus anteverted normal size shape and consistency. Adnexa: No masses.    Labs:    Pregnancy test is negative    Rads:   Radiological Procedure reviewed.    Signed by: Demetra Shiner, MD

## 2015-04-06 ENCOUNTER — Other Ambulatory Visit: Payer: Self-pay

## 2015-04-06 ENCOUNTER — Ambulatory Visit (HOSPITAL_BASED_OUTPATIENT_CLINIC_OR_DEPARTMENT_OTHER): Payer: No Typology Code available for payment source | Admitting: Anesthesiology

## 2015-04-06 ENCOUNTER — Encounter (HOSPITAL_BASED_OUTPATIENT_CLINIC_OR_DEPARTMENT_OTHER)
Admission: RE | Disposition: A | Discharge status: Home / Self Care | Payer: Self-pay | Source: Ambulatory Visit | Attending: Gynecology

## 2015-04-06 ENCOUNTER — Encounter (HOSPITAL_BASED_OUTPATIENT_CLINIC_OR_DEPARTMENT_OTHER): Payer: Self-pay

## 2015-04-06 ENCOUNTER — Ambulatory Visit
Admission: RE | Admit: 2015-04-06 | Discharge: 2015-04-06 | Disposition: A | Discharge status: Home / Self Care | Payer: No Typology Code available for payment source | Source: Ambulatory Visit | Attending: Gynecology | Admitting: Gynecology

## 2015-04-06 ENCOUNTER — Ambulatory Visit (HOSPITAL_BASED_OUTPATIENT_CLINIC_OR_DEPARTMENT_OTHER): Payer: No Typology Code available for payment source | Admitting: Gynecology

## 2015-04-06 DIAGNOSIS — R102 Pelvic and perineal pain: Secondary | ICD-10-CM | POA: Insufficient documentation

## 2015-04-06 DIAGNOSIS — I879 Disorder of vein, unspecified: Secondary | ICD-10-CM | POA: Insufficient documentation

## 2015-04-06 DIAGNOSIS — G8929 Other chronic pain: Secondary | ICD-10-CM | POA: Insufficient documentation

## 2015-04-06 DIAGNOSIS — G43909 Migraine, unspecified, not intractable, without status migrainosus: Secondary | ICD-10-CM | POA: Insufficient documentation

## 2015-04-06 DIAGNOSIS — K219 Gastro-esophageal reflux disease without esophagitis: Secondary | ICD-10-CM | POA: Insufficient documentation

## 2015-04-06 DIAGNOSIS — N803 Endometriosis of pelvic peritoneum: Secondary | ICD-10-CM | POA: Insufficient documentation

## 2015-04-06 DIAGNOSIS — R21 Rash and other nonspecific skin eruption: Secondary | ICD-10-CM | POA: Insufficient documentation

## 2015-04-06 HISTORY — PX: LAPAROSCOPY, LASER: SHX4586

## 2015-04-06 LAB — POCT PREGNANCY TEST, URINE HCG: POCT Pregnancy HCG Test, UR: NEGATIVE

## 2015-04-06 SURGERY — LAPAROSCOPY, LASER
Anesthesia: Anesthesia General | Site: Pelvis | Wound class: Clean Contaminated

## 2015-04-06 MED ORDER — FENTANYL CITRATE (PF) 50 MCG/ML IJ SOLN (WRAP)
INTRAMUSCULAR | Status: AC
Start: 2015-04-06 — End: ?
  Filled 2015-04-06: qty 2

## 2015-04-06 MED ORDER — FAMOTIDINE 10 MG/ML IV SOLN (WRAP)
INTRAVENOUS | Status: DC | PRN
Start: 2015-04-06 — End: 2015-04-06
  Administered 2015-04-06: 20 mg via INTRAVENOUS

## 2015-04-06 MED ORDER — FENTANYL CITRATE (PF) 50 MCG/ML IJ SOLN (WRAP)
INTRAMUSCULAR | Status: DC | PRN
Start: 2015-04-06 — End: 2015-04-06
  Administered 2015-04-06 (×4): 50 ug via INTRAVENOUS

## 2015-04-06 MED ORDER — ROCURONIUM BROMIDE 50 MG/5ML IV SOLN
INTRAVENOUS | Status: DC | PRN
Start: 2015-04-06 — End: 2015-04-06
  Administered 2015-04-06: 40 mg via INTRAVENOUS

## 2015-04-06 MED ORDER — ROCURONIUM BROMIDE 50 MG/5ML IV SOLN
INTRAVENOUS | Status: AC
Start: 2015-04-06 — End: ?
  Filled 2015-04-06: qty 5

## 2015-04-06 MED ORDER — FENTANYL CITRATE (PF) 50 MCG/ML IJ SOLN (WRAP)
25.0000 ug | INTRAMUSCULAR | Status: DC | PRN
Start: 2015-04-06 — End: 2015-04-06
  Administered 2015-04-06 (×2): 25 ug via INTRAVENOUS

## 2015-04-06 MED ORDER — BUPIVACAINE-EPINEPHRINE (PF) 0.5% -1:200000 IJ SOLN
INTRAMUSCULAR | Status: DC | PRN
Start: 2015-04-06 — End: 2015-04-06
  Administered 2015-04-06: 4 mL

## 2015-04-06 MED ORDER — NEOSTIGMINE METHYLSULFATE 1 MG/ML IJ SOLN
INTRAMUSCULAR | Status: DC | PRN
Start: 2015-04-06 — End: 2015-04-06
  Administered 2015-04-06: 3 mg via INTRAVENOUS

## 2015-04-06 MED ORDER — DEXAMETHASONE SODIUM PHOSPHATE 20 MG/5ML IJ SOLN
INTRAMUSCULAR | Status: AC
Start: 2015-04-06 — End: ?
  Filled 2015-04-06: qty 5

## 2015-04-06 MED ORDER — BUPIVACAINE-EPINEPHRINE (PF) 0.5% -1:200000 IJ SOLN
INTRAMUSCULAR | Status: AC
Start: 2015-04-06 — End: ?
  Filled 2015-04-06: qty 30

## 2015-04-06 MED ORDER — GLYCOPYRROLATE 0.2 MG/ML IJ SOLN
INTRAMUSCULAR | Status: DC | PRN
Start: 2015-04-06 — End: 2015-04-06
  Administered 2015-04-06: 0.4 mg via INTRAVENOUS

## 2015-04-06 MED ORDER — HYDROMORPHONE HCL 1 MG/ML IJ SOLN
0.5000 mg | INTRAMUSCULAR | Status: DC | PRN
Start: 2015-04-06 — End: 2015-04-06

## 2015-04-06 MED ORDER — GLYCOPYRROLATE 0.2 MG/ML IJ SOLN
INTRAMUSCULAR | Status: AC
Start: 2015-04-06 — End: ?
  Filled 2015-04-06: qty 1

## 2015-04-06 MED ORDER — LIDOCAINE HCL (PF) 2 % IJ SOLN
INTRAMUSCULAR | Status: AC
Start: 2015-04-06 — End: ?
  Filled 2015-04-06: qty 5

## 2015-04-06 MED ORDER — MIDAZOLAM HCL 2 MG/2ML IJ SOLN
INTRAMUSCULAR | Status: AC
Start: 2015-04-06 — End: ?
  Filled 2015-04-06: qty 2

## 2015-04-06 MED ORDER — ACETAMINOPHEN 500 MG PO TABS
1000.0000 mg | ORAL_TABLET | Freq: Once | ORAL | Status: AC
Start: 2015-04-06 — End: 2015-04-06
  Administered 2015-04-06: 1000 mg via ORAL

## 2015-04-06 MED ORDER — HYDROCODONE-ACETAMINOPHEN 5-300 MG PO TABS
1.0000 | ORAL_TABLET | ORAL | 0 refills | Status: DC | PRN
Start: 2015-04-06 — End: 2017-06-07
  Filled 2015-04-06: qty 10, 2d supply, fill #0

## 2015-04-06 MED ORDER — KETOROLAC TROMETHAMINE 30 MG/ML IJ SOLN
INTRAMUSCULAR | Status: DC | PRN
Start: 2015-04-06 — End: 2015-04-06
  Administered 2015-04-06: 30 mg via INTRAVENOUS

## 2015-04-06 MED ORDER — MIDAZOLAM HCL 2 MG/2ML IJ SOLN
INTRAMUSCULAR | Status: DC | PRN
Start: 2015-04-06 — End: 2015-04-06
  Administered 2015-04-06: 2 mg via INTRAVENOUS

## 2015-04-06 MED ORDER — LIDOCAINE HCL (PF) 1 % IJ SOLN
INTRAMUSCULAR | Status: DC | PRN
Start: 2015-04-06 — End: 2015-04-06
  Administered 2015-04-06: 100 mg via INTRAVENOUS

## 2015-04-06 MED ORDER — MEPERIDINE HCL 25 MG/ML IJ SOLN
12.5000 mg | INTRAMUSCULAR | Status: DC | PRN
Start: 2015-04-06 — End: 2015-04-06

## 2015-04-06 MED ORDER — LACTATED RINGERS IV SOLN
INTRAVENOUS | Status: DC
Start: 2015-04-06 — End: 2015-04-06
  Administered 2015-04-06: 20 mL/h via INTRAVENOUS

## 2015-04-06 MED ORDER — ONDANSETRON HCL 4 MG/2ML IJ SOLN
4.0000 mg | Freq: Once | INTRAMUSCULAR | Status: DC | PRN
Start: 2015-04-06 — End: 2015-04-06

## 2015-04-06 MED ORDER — PROPOFOL 10 MG/ML IV EMUL (WRAP)
INTRAVENOUS | Status: AC
Start: 2015-04-06 — End: ?
  Filled 2015-04-06: qty 20

## 2015-04-06 MED ORDER — ACETAMINOPHEN 500 MG PO TABS
ORAL_TABLET | ORAL | Status: AC
Start: 2015-04-06 — End: ?
  Filled 2015-04-06: qty 2

## 2015-04-06 MED ORDER — CEFAZOLIN SODIUM 1 G IJ SOLR
INTRAMUSCULAR | Status: AC
Start: 2015-04-06 — End: ?
  Filled 2015-04-06: qty 1000

## 2015-04-06 MED ORDER — ONDANSETRON HCL 4 MG/2ML IJ SOLN
INTRAMUSCULAR | Status: AC
Start: 2015-04-06 — End: ?
  Filled 2015-04-06: qty 2

## 2015-04-06 MED ORDER — DIPHENHYDRAMINE HCL 50 MG/ML IJ SOLN
6.2500 mg | INTRAMUSCULAR | Status: DC | PRN
Start: 2015-04-06 — End: 2015-04-06

## 2015-04-06 MED ORDER — CEFAZOLIN SODIUM 1 G IJ SOLR
1.0000 g | Freq: Once | INTRAMUSCULAR | Status: AC
Start: 2015-04-06 — End: 2015-04-06
  Administered 2015-04-06: 1 g via INTRAVENOUS

## 2015-04-06 MED ORDER — METOCLOPRAMIDE HCL 5 MG/ML IJ SOLN
10.0000 mg | Freq: Once | INTRAMUSCULAR | Status: DC | PRN
Start: 2015-04-06 — End: 2015-04-06

## 2015-04-06 MED ORDER — GABAPENTIN 300 MG PO CAPS
300.0000 mg | ORAL_CAPSULE | Freq: Once | ORAL | Status: AC
Start: 2015-04-06 — End: 2015-04-06
  Administered 2015-04-06: 300 mg via ORAL

## 2015-04-06 MED ORDER — DEXAMETHASONE SODIUM PHOSPHATE 4 MG/ML IJ SOLN (WRAP)
INTRAMUSCULAR | Status: DC | PRN
Start: 2015-04-06 — End: 2015-04-06
  Administered 2015-04-06: 6 mg via INTRAVENOUS

## 2015-04-06 MED ORDER — HYDROMORPHONE HCL 2 MG PO TABS
2.0000 mg | ORAL_TABLET | Freq: Once | ORAL | Status: DC | PRN
Start: 2015-04-06 — End: 2015-04-06

## 2015-04-06 MED ORDER — SODIUM CHLORIDE 0.9 % IR SOLN
Status: DC | PRN
Start: 2015-04-06 — End: 2015-04-06
  Administered 2015-04-06: 700 mL

## 2015-04-06 MED ORDER — PROPOFOL INFUSION 10 MG/ML
INTRAVENOUS | Status: DC | PRN
Start: 2015-04-06 — End: 2015-04-06
  Administered 2015-04-06: 50 mg via INTRAVENOUS
  Administered 2015-04-06: 150 mg via INTRAVENOUS

## 2015-04-06 MED ORDER — HYDROMORPHONE HCL 2 MG PO TABS
ORAL_TABLET | ORAL | Status: AC
Start: 2015-04-06 — End: ?
  Filled 2015-04-06: qty 1

## 2015-04-06 MED ORDER — ONDANSETRON HCL 4 MG/2ML IJ SOLN
INTRAMUSCULAR | Status: DC | PRN
Start: 2015-04-06 — End: 2015-04-06
  Administered 2015-04-06: 4 mg via INTRAVENOUS

## 2015-04-06 MED ORDER — KETOROLAC TROMETHAMINE 60 MG/2ML IM SOLN
INTRAMUSCULAR | Status: AC
Start: 2015-04-06 — End: ?
  Filled 2015-04-06: qty 2

## 2015-04-06 MED ORDER — LACTATED RINGERS IV SOLN
125.0000 mL/h | INTRAVENOUS | Status: DC
Start: 2015-04-06 — End: 2015-04-06

## 2015-04-06 MED ORDER — GABAPENTIN 300 MG PO CAPS
ORAL_CAPSULE | ORAL | Status: AC
Start: 2015-04-06 — End: ?
  Filled 2015-04-06: qty 1

## 2015-04-06 MED ORDER — GLYCOPYRROLATE 0.2 MG/ML IJ SOLN
INTRAMUSCULAR | Status: AC
Start: 2015-04-06 — End: ?
  Filled 2015-04-06: qty 2

## 2015-04-06 MED ORDER — FAMOTIDINE 20 MG/2ML IV SOLN
INTRAVENOUS | Status: AC
Start: 2015-04-06 — End: ?
  Filled 2015-04-06: qty 2

## 2015-04-06 MED ORDER — HYDROMORPHONE HCL 2 MG PO TABS
2.0000 mg | ORAL_TABLET | Freq: Once | ORAL | Status: AC
Start: 2015-04-06 — End: 2015-04-06
  Administered 2015-04-06: 2 mg via ORAL

## 2015-04-06 SURGICAL SUPPLY — 46 items
BAG FOLEY DRAIN 16FR (Catheter Micellaneous) ×1
BAG FOLEY DRAIN 16FR (Catheter Miscellaneous) ×1 IMPLANT
CANNISTER SUCTION 3000CC (Suction) ×2 IMPLANT
COVER MAYO CNVRT STND 23IN PLS REINF (Drape) ×1
COVER STAND W23 IN REINFORCE PLASTIC (Drape) ×1 IMPLANT
COVER STND PLS MAYO CNVRT 23IN LF STRL (Drape) ×1 IMPLANT
DEPRESSOR TONGUE JR BIRCHWOOD (Procedure Accessories) ×2 IMPLANT
DRAPE SRG CNVRT 44X40IN LF STRL FLTR (Drape) ×2 IMPLANT
DRAPE SURGICAL FILTER SCREEN FLUID CONTROL POUCH DRAINAGE PORT L44 IN (Drape) ×1 IMPLANT
EYE PAD CURITY 1-5/8X2-5/8IN (Dressing) ×4 IMPLANT
GLOVE SRG NTR RBR 7 BGL IND 288X91MM LTX (Glove) ×2
GLOVE SURG BIOGEL SZ6.5 (Glove) ×4 IMPLANT
GLOVE SURGICAL 7 BIOGEL INDICATOR POWDER (Glove) ×1 IMPLANT
GLOVE SURGICAL 7 BIOGEL INDICATOR POWDER FREE SMOOTH BEAD CUFF (Glove) ×1 IMPLANT
INACTIVE USE LAWSON 120900 (Gown) IMPLANT
IRRIGATOR SUCTN PUMP/HANDPIECE (Suction) ×2 IMPLANT
LEGGINGS SRG CNVRT 48X31IN LF STRL TLSCP (Drape) ×2
LEGGINGS SURGICAL L48 IN X W31 IN (Drape) ×1 IMPLANT
LEGGINGS SURGICAL L48 IN X W31 IN TELESCOPIC FOLD CUFF L6 IN (Drape) ×1 IMPLANT
NEEDLE INJ SFTY 22GX1.5IN (Needles) ×2 IMPLANT
NEEDLE INSUFFLATION 120MM (Needles) IMPLANT
NEEDLE INSUFFLATION 150MM (Needles) IMPLANT
NEEDLE REG BEVEL 19GX1.5IN (Needles) ×2 IMPLANT
PACK LAPAROSCOPY ~~LOC~~ (Pack) ×2 IMPLANT
PAD ELECTROSRG GRND REM W CRD (Procedure Accessories) ×2 IMPLANT
PAD SANITARY L12.25 IN X W4.25 IN HEAVY ABSORBENT MOISTURE BARRIER (Dressing) IMPLANT
PAD SNTR SLK FLF CRTY 12.25X4.25IN LF NS (Dressing) ×2 IMPLANT
POUCH INSTRUMENT (Drape) ×2 IMPLANT
STRIP SKIN CLOSURE L4 IN X W1/2 IN (Dressing) ×1 IMPLANT
STRIP SKIN CLOSURE L4 IN X W1/2 IN REINFORCE STERI-STRIP POLYESTER (Dressing) ×1 IMPLANT
STRIP SKNCLS PLSTR STRSTRP 4X.5IN LF (Dressing) ×2
SUTURE MONOCRYL 4-0 PS2 27IN (Suture) IMPLANT
SUTURE VICRYL 0 UR6 27IN (Suture) IMPLANT
SUTURE VICRYL 5-0 P3 18IN (Suture) IMPLANT
SYRINGE 50 ML GRADUATE NONPYROGENIC DEHP (Syringes, Needles) IMPLANT
SYRINGE 50 ML GRADUATE NONPYROGENIC DEHP FREE PVC FREE LOK MEDICAL (Syringes, Needles) IMPLANT
SYRINGE LUER LOCK 10CC (Syringes, Needles) ×2 IMPLANT
SYRINGE MED 50ML LL LF STRL GRAD N-PYRG (Syringes, Needles)
TOWEL STERILE REUSABLE 8PK (Procedure Accessories) ×2 IMPLANT
TROCAR BLADELESS ENDO 5X100MM (Laparoscopy Supplies) ×2 IMPLANT
TROCAR ENDO BLADELESS 11X100MM (Laparoscopy Supplies) ×2 IMPLANT
TROCAR XCEL SLEEVE COMP (Laparoscopy Supplies) IMPLANT
TUBE SET DISP HIGH FLOW (Tubing) ×1
TUBING INSFL THRMPLST 45L PNEUMOSURE (Tubing) ×1
TUBING INSUFFLATION SET HIGH FLOW (Tubing) ×1 IMPLANT
TUBING INSUFFLATION SET HIGH FLOW TOUCHSCREEN PNEUMOSURE THERMOPLASTIC (Tubing) ×1 IMPLANT

## 2015-04-06 NOTE — Brief Op Note (Signed)
BRIEF OP NOTE    Date Time: 04/06/2015 1:58 PM    Patient Name:   Madeline Anderson    Date of Operation:   04/06/2015    Providers Performing:   Surgeon(s):  Demetra Shiner, MD  Luisa Hart Kimberlee Nearing, MD    Assistant (s):   Circulator: Laurel Dimmer, RN  Scrub Person: Sarina Ser  Team Leader: Stann Ore, RN    Operative Procedure:   Procedure(s):  LAPAROSCOPY, DIAGNOSTIC AND CAUTERY OF ENDOMETRIAL IMPLANTS     Preoperative Diagnosis:   Pre-Op Diagnosis Codes:     * Pelvic pain in female [R10.2]    Postoperative Diagnosis:   Post-Op Diagnosis Codes:     * Pelvic pain in female [R10.2]    Anesthesia:   General    Estimated Blood Loss:    5 mL    Implants:   * No implants in log *    Drains:   Drains: no    Specimens:       Findings:   Endometrial implants in the cul-de-sac and on the uterosacral ligaments    Complications:   None      Signed by: Demetra Shiner, MD

## 2015-04-06 NOTE — Discharge Instructions (Signed)
Discharge Instructions for Pelvic Laparoscopy  You had a procedure called pelvic laparoscopy. During this procedure, your doctor used small incisions in your abdomen toexamine your abdominal or pelvic organs and possibly to perform a procedure.Recovery from laparoscopy is faster than from open abdominal surgery (called laparotomy). Here's what you can do to speed your recovery following surgery.  What to expect  Remember, you can expect the following:   The incisions on your abdomen may be tender or sore.   Pain in your upper back or shoulders from the gas used to enlarge your abdomen to allow your doctor to see inside your pelvis and perform the procedure. The pain usually goes away in a day or two.   Light bleeding from the vagina   Soreness, bruising, and mild swelling near incisions   Burning with urination for a few days   Constipation   Feeling tired, especially for the first 24 hours you are home  Activity   Take it easy for the rest of the day after you are discharged. Each day, do a little more as you feel able.   Don't stay in bed. Get up and move around.   Don't drive until your doctor advises that it's safe.   Avoid strenuous activity for 2 weeks.   Don't put anything in your vagina until your doctor tells you it is safe to do so. This includes tampons, douches and sexual activity.  Incision and other care   Take pain medicines as directed.   Don't drink alcohol while on pain medicines.   Wrap an ice pack or bag of frozen peas in a plastic bag; then cover with a thin cloth. Place it over the bandaged incision area for no longer than 20 minutes at a time. Do this as needed to reduce pain and keep swelling down.   Don't pull off the strips of tapeused to close your incisions. Let them fall off on their own.   If you have a gauze bandage, replace it after 24 hours.   Shower as needed. If you still have a bandage, cover it with a plastic bag to keep it dry. Take the plastic bag off  when you are done with your shower.   Don't swim or take a tub bath for 2 weeks.  Follow-up  Make a follow-up appointment as directed by our staff.          2000-2015 [employer] [address], [place] [address]. All rights reserved. This information is not intended as a substitute for professional medical care. Always follow your healthcare professional's instructions.    Post Anesthesia Discharge Instructions    Although you may be awake and alert in the recovery room, small amounts of anesthetic remain in your system for about 24 hours.  You may feel tired and sleepy during this time.      You are advised to go directly home from the hospital.    Plan to stay at home and rest for the remainder of the day.    It is advisable to have someone with you at home for 24 hours after surgery.    Do not operate a motor vehicle, or any mechanical or electrical equipment for the next 24 hours.      Be careful when you are walking around, you may become dizzy.  The effects of anesthesia and/or medications are still present and drowsiness may occur    Do not consume alcohol, tranquilizers, sleeping medications, or   any other non prescribed medication for the remainder of the day.    Diet:  begin with liquids, progress your diet as tolerated or as directed by your surgeon.  Nausea and vomiting may occur in the next 24 hours.

## 2015-04-06 NOTE — Anesthesia Preprocedure Evaluation (Addendum)
Anesthesia Evaluation    AIRWAY    Mallampati: I    TM distance: >3 FB  Neck ROM: full  Mouth Opening:full   CARDIOVASCULAR    cardiovascular exam normal       DENTAL    no notable dental hx     PULMONARY    pulmonary exam normal     OTHER FINDINGS    S/f laser laparoscopy for pelvic pain    Pmhx:  adhd  Anxiety  Gerd, occ; no meds now.  H/o syncope after field hockey workout, neg cardiac workup.                  Anesthesia Plan    ASA 2     general                     intravenous induction   Detailed anesthesia plan: general endotracheal        Post op pain management: per surgeon and PO analgesics    informed consent obtained    Plan discussed with CRNA.

## 2015-04-06 NOTE — Transfer of Care (Signed)
Anesthesia Transfer of Care Note    Patient: Madeline Anderson    Procedures performed: Procedure(s):  LAPAROSCOPY, DIAGNOSTIC AND CAUTERY OF ENDOMETRIAL IMPLANTS     Anesthesia type: General ETT    Patient location:Gyn PACU    Last vitals:   Filed Vitals:    04/06/15 1417   BP: 147/74   Pulse: 109   Temp: 36.5 C (97.7 F)   Resp: 16   SpO2: 100%       Post pain: Continue adjustment of pain medication; Recovery to give PRN meds     Mental Status:awake and alert     Respiratory Function: tolerating room air    Cardiovascular: stable    Nausea/Vomiting: patient not complaining of nausea or vomiting    Hydration Status: adequate    Post assessment: no apparent anesthetic complications and no reportable events

## 2015-04-06 NOTE — Interval H&P Note (Signed)
No change since dictation.    Madeline Sacramento, MD

## 2015-04-06 NOTE — PACU (Addendum)
Pt rates pain "4.5" out of ten in mid abdomen worse with movement.  Offered pt fentanyl for pain but pt said she prefers to go home and is ok enough to go home.  Verbal and written discharge instructions given to pt and pt's father, waiting for pharmacy to bring prescription, pt drinking.  Vital signs stable on room air, pt voided.  Will [place] iv and discharge via wheel chair.

## 2015-04-06 NOTE — Anesthesia Postprocedure Evaluation (Signed)
Anesthesia Post Evaluation    Patient: Madeline Anderson    Procedure(s):  LAPAROSCOPY, DIAGNOSTIC AND CAUTERY OF ENDOMETRIAL IMPLANTS     Anesthesia type: general    Last Vitals:   Filed Vitals:    04/06/15 1550   BP: 117/69   Pulse: 61   Temp:    Resp:    SpO2: 100%       Patient Location: Phase II PACU      Post Pain: Patient not complaining of pain, continue current therapy    Mental Status: awake and alert    Respiratory Function: tolerating room air    Cardiovascular: stable    Nausea/Vomiting: patient not complaining of nausea or vomiting    Hydration Status: adequate    Post Assessment: no apparent anesthetic complications, no reportable events and no evidence of recall          Anesthesia Qualified Clinical Data Registry    Central Line      CVC insertion : NO                                               Perioperative temperature management      General/neuraxial anesthesia > or = 60 minutes (excluding CABG) : YES              > Use of intraoperative active warming : YES              > Temperature > or = 36 degrees Centigrade (96.8 degrees Farenheit) during time span from 30 minutes before up to 15 minutes after anesthesia end time : YES      Administration of antibiotic prophylaxis      Age > or = 18, with IV access, with surgical procedure for which antibiotic prophylaxis indicated, and not on chronic antibiotics : YES              > Prophylactic antibiotics within 1 hour of incision (or fluroroquinolone/vancomycin within 2 hours of incision) : YES    Medication Administration      Ordering or administration of drug inconsistent with intended drug, dose, delivery or timing : NO      Dental loss/damage      Dental injury with administration of anesthesia : NO      Difficult intubation due to unrecognized difficult airway        Elective airway procedure including but not limited to: tracheostomy, fiberoptic bronchoscopy, rigid bronchoscopy; jet ventilation; or elective use of a device to facilitate airway  management such as a Glidescope : NO                > Unanticipated difficult intubation post pre-evaluation : NO      Aspiration of gastric contents        Aspiration of gastric contents : NO                    Surgical fire        Procedure requiring electrocautery/laser : YES                > Ignition/burning in invasive procedure location : NO      Immediate perioperative cardiac arrest        Cardiac arrest in OR or PACU : NO  Unplanned hospital admission        Unplanned hospital admission for initially intended outpatient anesthesia service : NO      Unplanned ICU admission        Unplanned ICU admission related to anesthesia occurring within 24 hours of induction or start of MAC : NO      Surgical case cancellation        Cancellation of procedure after care already started by anesthesia care team : NO      Post-anesthesia transfer of care checklist/protocol to PACU        Transfer from OR to PACU upon case conclusion : YES              > Use of PACU transfer checklist/protocol : YES     (Includes the key elements of: patient identification, responsible practitioner identification (PACU nurse or advanced practitioner), discussion of pertinent history and procedure course, intraoperative anesthetic management, post-procedure plans, acknowledgement/questions)    Post-anesthesia transfer of care checklist/protocol to ICU        Transfer from OR to ICU upon case conclusion : NO                    Post-operative nausea/vomiting risk protocol        Post-operative nausea/vomiting risk protocol : YES  Patient > or = 18 with care initiated by anesthesia team that has a risk factor screen for post-op nausea/vomiting (Includes female, hx PONV, or motion sickness, non-smoker, intended opioid administration for post-op analgesia.)    Anaphylaxis        Anaphylaxis during anesthesia services : NO    (Inclusive of any suspected transfusion reaction in association with blood-bank confirmed blood product  incompatibility)              Godfrey Pick Tomoko Sandra, 04/06/2015 4:14 PM

## 2015-04-06 NOTE — Op Note (Signed)
The patient was taken to the operating room where general anesthesia was administered and noted to be adequate. The patient was then placed in the dorsal lithotomy position with her lower extremities placed in the [place] stirrups. The patient was prepped and draped in the normal sterile fashion with foley catheter placed. A sterile Graves speculum was placed in the vagina with adequate visualization of the cervix. The anterior lip of the cervix was then grasped with a single-tooth tenaculum for stabilization and manipulation of the cervix. An acorn manipulator was advanced into the cervix and was attached to the single tooth tenaculum.   Attention was then turned to the abdomen with the patient in supine positioning. A 5-mm incision was performed at the left umbilical fold for the primary entry site. The 5mm trocar was advanced under direct visualization at 45-degree angle directed toward the pelvis until the abdominal cavity was reached, while tenting the anterior abdominal wall. Gas insufflation was initiated, pneumoperitoneum obtained. The patient was placed in Trendelenburg positioning for improved visualization. A second 5-mm incision 2-cm above the symphysis pubis was performed with transillumination and a second trocar was advanced under direct visualization. A blunt probe was inserted into the second trocar port for aid in manipulation and visualization. Anatomy, as noted above, included a normal appearing uterus, ovaries and tubes. Endometriotic implants were seen in the cul-de-sac and on the uterosacral ligaments. These implants were fulgurated using the Bovi spatula. Excellent hemostasis was obtained after the end of the fulguration. No further interventions were indicated. The abdomen was deflated with manual pressure and the instruments were removed under direct visualization.    The  skin incisions were re-approximated with Vicryl and steri-strips. Marcaine 0.5% solution with epinepherine was injected  locally at incision for additional local anesthesia and analgesia. Dressings were placed at incision sites using Band-Aid dressing. The acorn uterine manipulator, single-toothed tenaculum, and Graves speculum were removed with excellent hemostasis noted.    The patient tolerated the procedure well. All lap and sponge counts were correct times two. The patient was transported to the PACU in stable condition.    Glenice Bow, MD  PGY 1

## 2015-06-30 IMAGING — CT CT HEAD WITHOUT CONTRAST
1 series · 16 of 30 positions shown, 20 images · non-contrast
Comparison: None.

CLINICAL DATA: Football injury, headache and possible loss of
consciousness.

EXAM:
CT HEAD WITHOUT CONTRAST
TECHNIQUE: Contiguous axial images were obtained from the base of the skull
through the vertex without intravenous contrast.

[Series 2: head wo · axial · 0.41mm/px · z∈[-67,+77]mm · 16 of 36 slices shown, 20 images]
[im 2/36  brain]
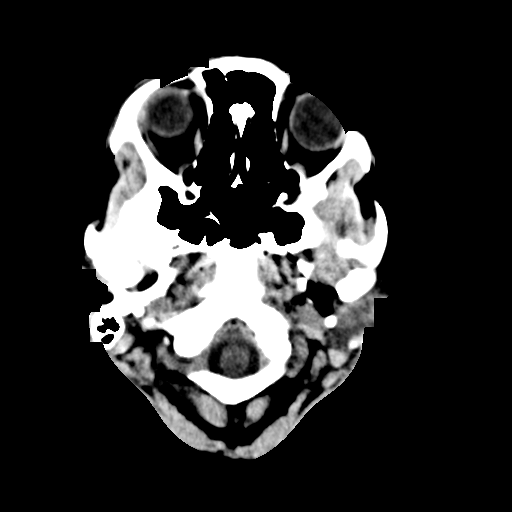
[im 2/36  bone]
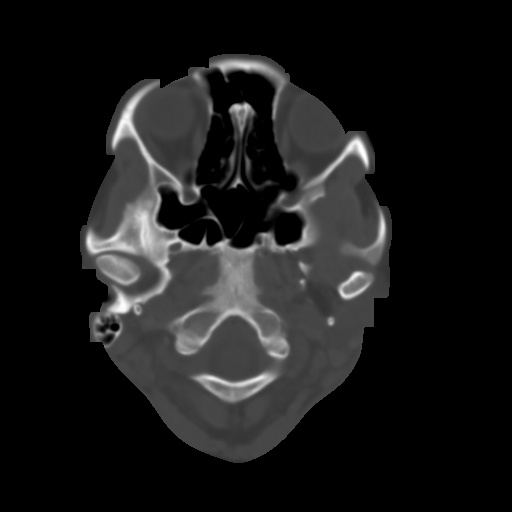
[im 4/36  brain]
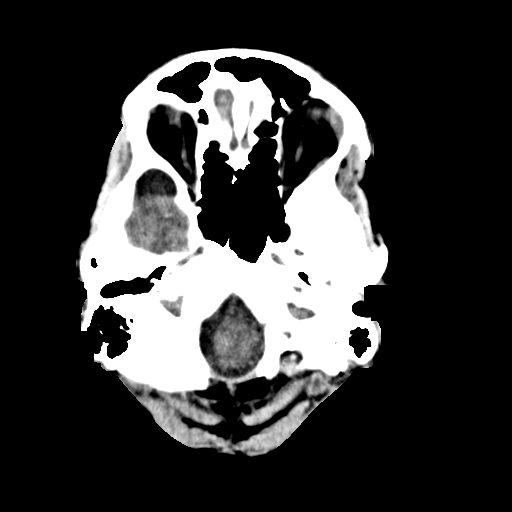
[im 7/36  brain]
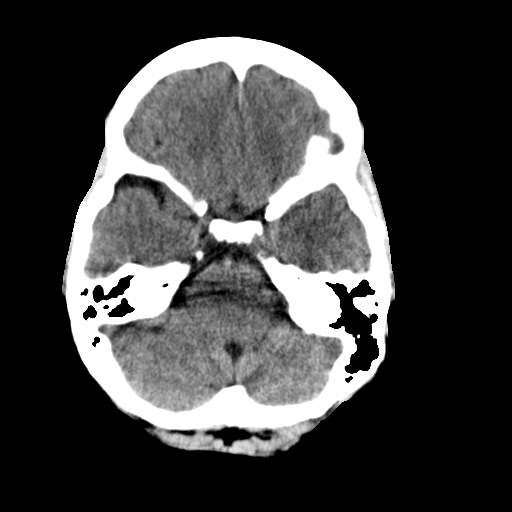
[im 9/36  brain]
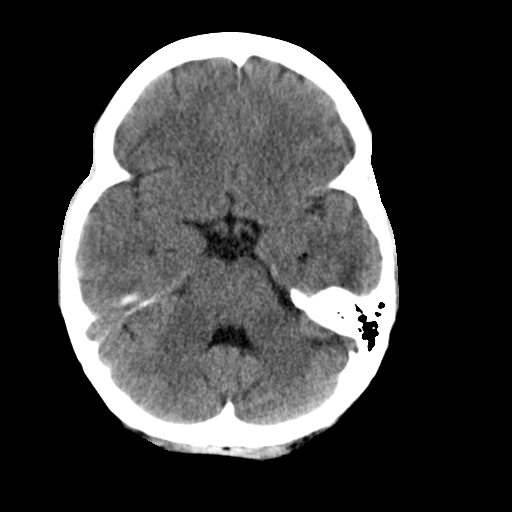
[im 10/36  brain]
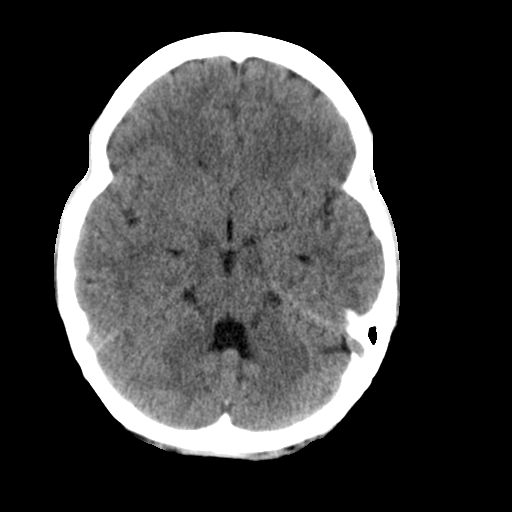
[im 10/36  bone]
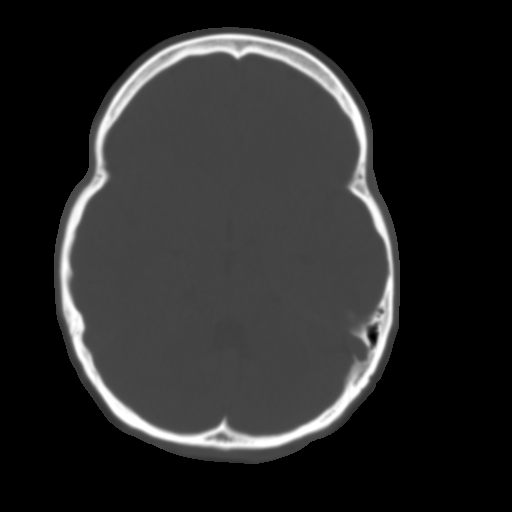
[im 13/36  brain]
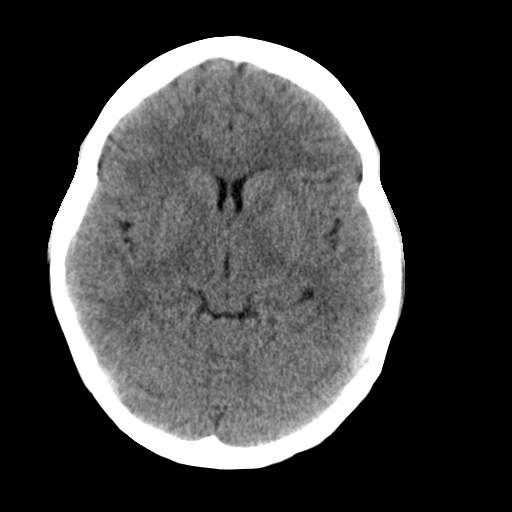
[im 15/36  brain]
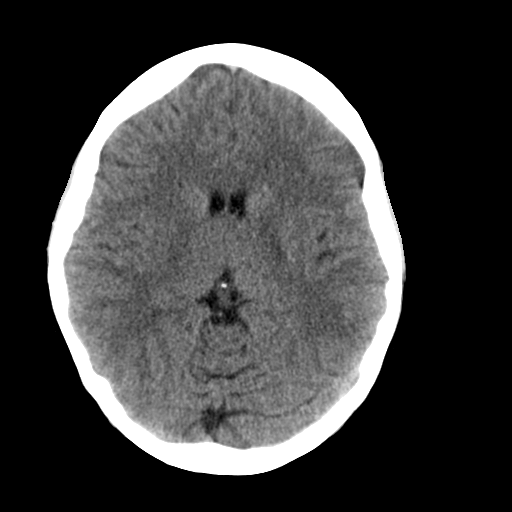
[im 17/36  brain]
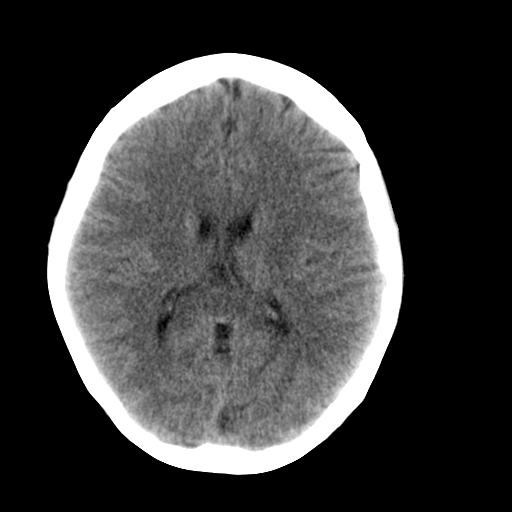
[im 19/36  brain]
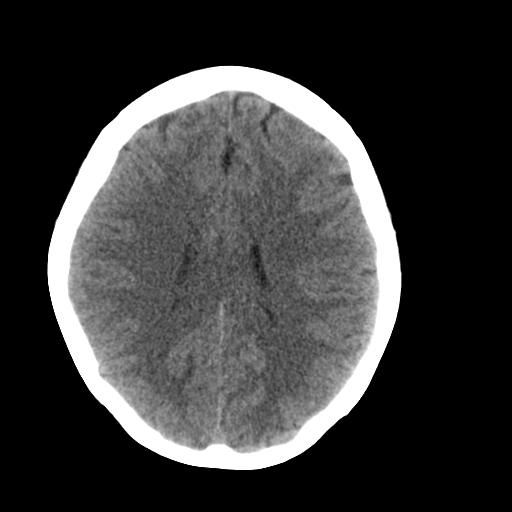
[im 19/36  bone]
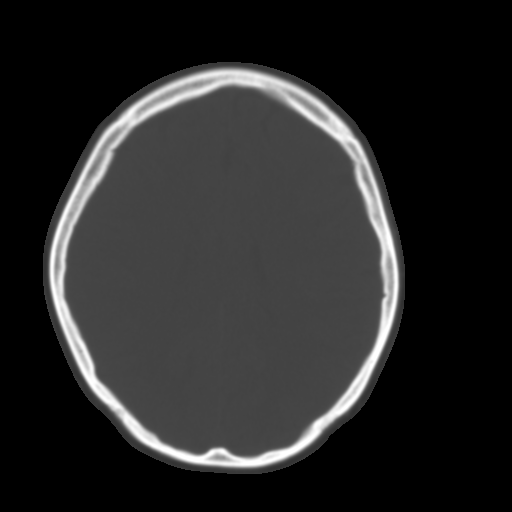
[im 21/36  brain]
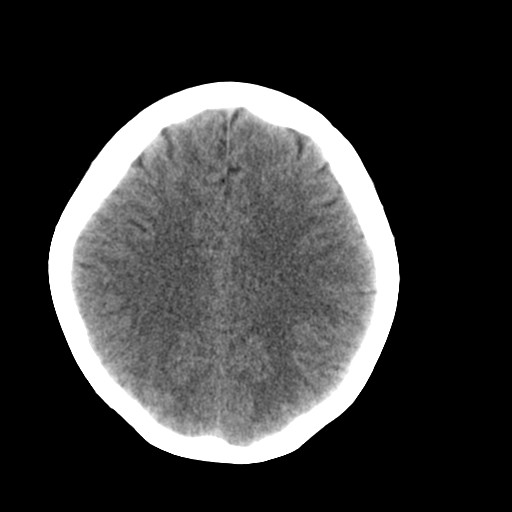
[im 23/36  brain]
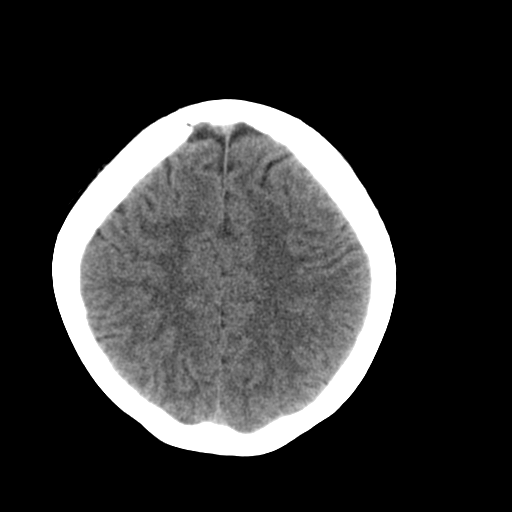
[im 26/36  brain]
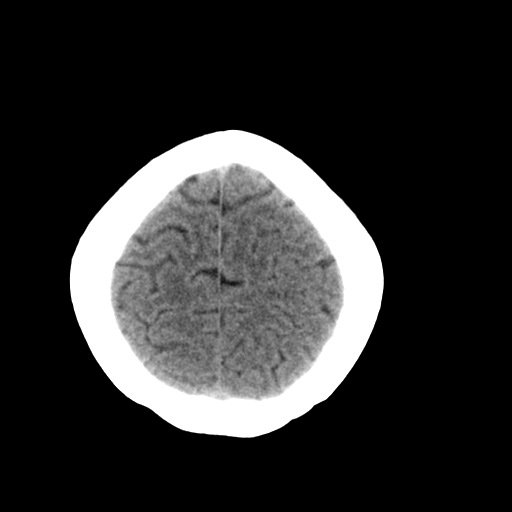
[im 27/36  brain]
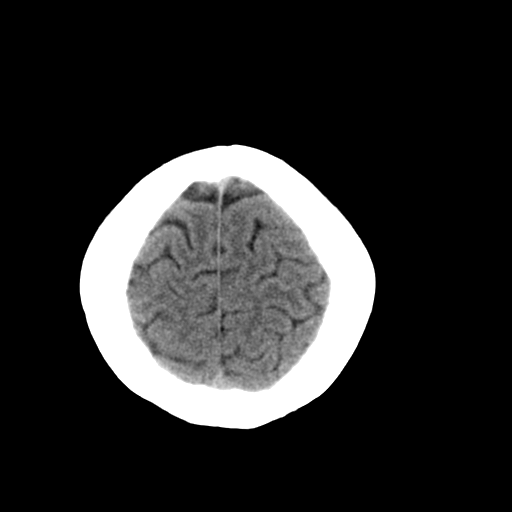
[im 27/36  bone]
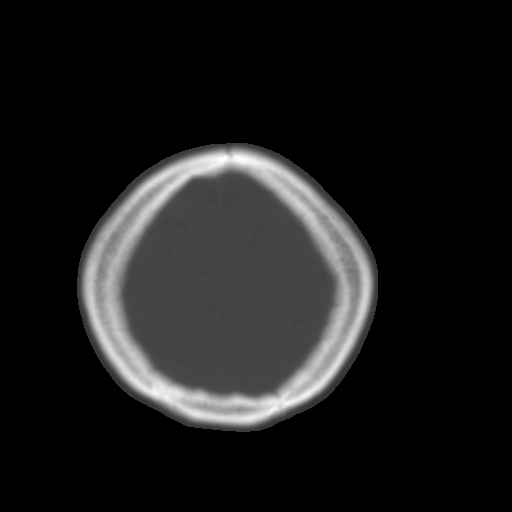
[im 29/36  brain]
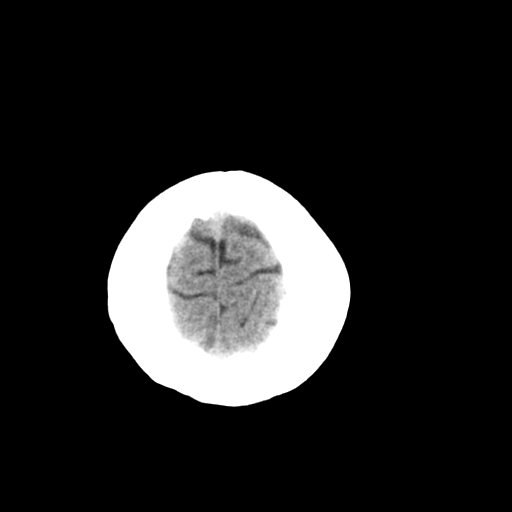
[im 32/36  brain]
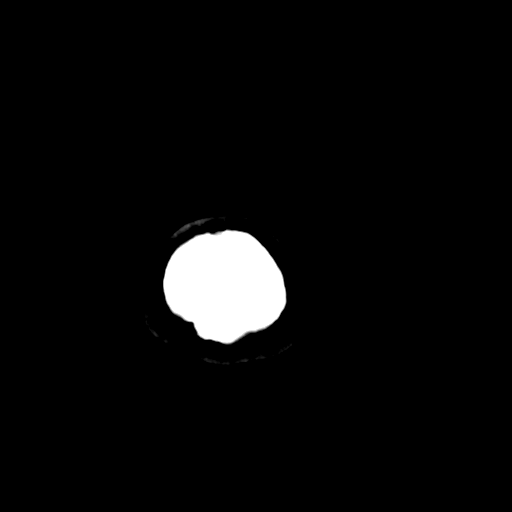
[im 34/36  brain]
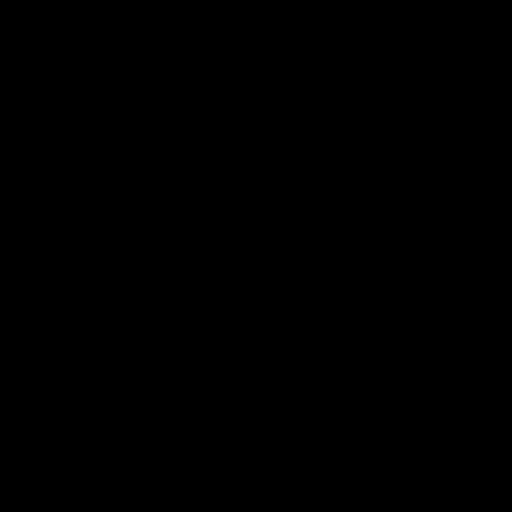

[16 of 30 positions shown; findings below may reference images not displayed]

FINDINGS: The ventricles and sulci are normal. No intraparenchymal hemorrhage,
mass effect nor midline shift. No acute large vascular territory
infarcts.

No abnormal extra-axial fluid collections. Basal cisterns are
patent.

No skull fracture. The included ocular globes and orbital contents
are non-suspicious. The mastoid aircells and included paranasal
sinuses are well-aerated.
IMPRESSION: No acute intracranial process; normal noncontrast CT of the head.

  By: Leonelito Mishaan

## 2015-06-30 IMAGING — CR RIGHT TIBIA AND FIBULA - 2 VIEW
1 series · 2 of 2 positions shown · non-contrast
Comparison: None.

CLINICAL DATA: Football injury, RIGHT lower extremity pain.

EXAM:
RIGHT TIBIA AND FIBULA - 2 VIEW

[Series 1: dxr tibia and fibula rt (lower l · 0.14mm/px · 2 of 2 slices shown]
[im 1/2]
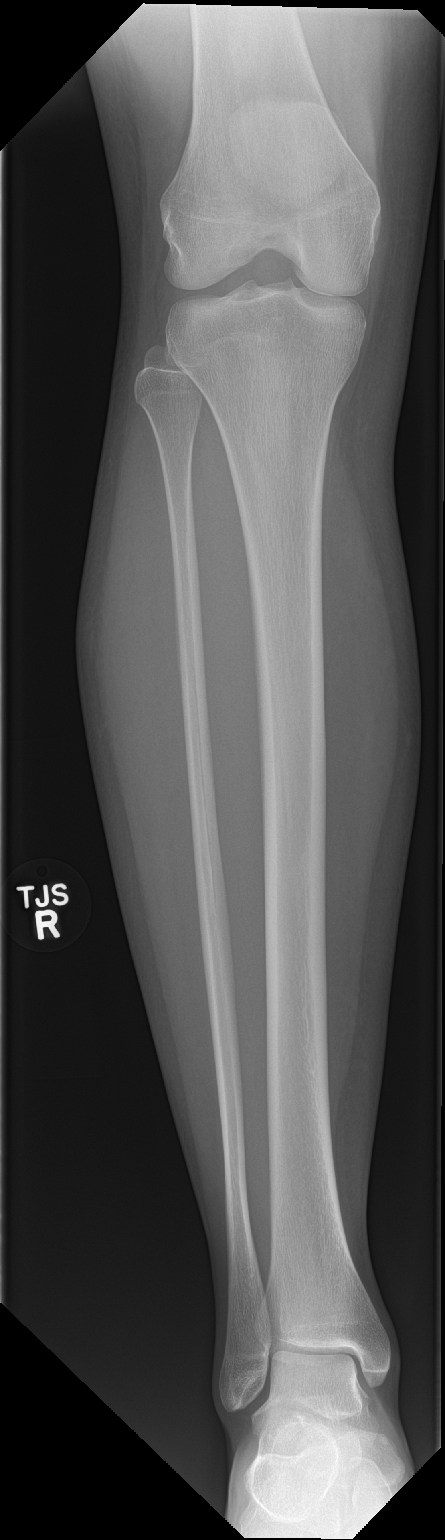
[im 2/2]
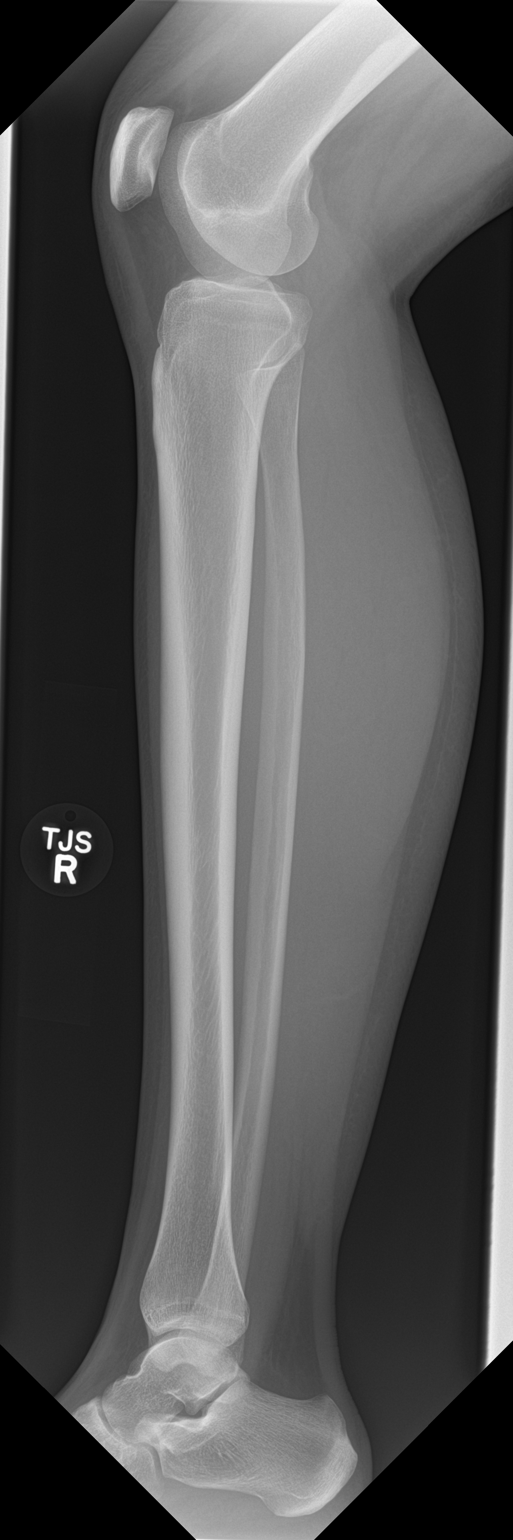

[2 of 2 positions shown; findings below may reference images not displayed]

FINDINGS: There is no evidence of fracture or other focal bone lesions. Soft
tissues are unremarkable.
IMPRESSION: Negative.

  By: Hamlet Kat

## 2016-02-08 ENCOUNTER — Other Ambulatory Visit: Payer: Self-pay | Admitting: Internal Medicine

## 2016-02-08 DIAGNOSIS — Z8669 Personal history of other diseases of the nervous system and sense organs: Secondary | ICD-10-CM

## 2016-02-09 ENCOUNTER — Ambulatory Visit: Payer: No Typology Code available for payment source | Attending: Internal Medicine

## 2016-02-09 DIAGNOSIS — Z8669 Personal history of other diseases of the nervous system and sense organs: Secondary | ICD-10-CM

## 2016-02-09 DIAGNOSIS — D332 Benign neoplasm of brain, unspecified: Secondary | ICD-10-CM | POA: Insufficient documentation

## 2016-02-09 DIAGNOSIS — G930 Cerebral cysts: Secondary | ICD-10-CM | POA: Insufficient documentation

## 2016-02-09 MED ORDER — GADOBUTROL 1 MMOL/ML IV SOLN
6.0000 mL | Freq: Once | INTRAVENOUS | Status: AC | PRN
Start: 2016-02-09 — End: 2016-02-09
  Administered 2016-02-09: 6 mmol via INTRAVENOUS
  Filled 2016-02-09: qty 7.5

## 2016-05-09 ENCOUNTER — Other Ambulatory Visit: Payer: Self-pay | Admitting: Gastroenterology

## 2016-05-09 DIAGNOSIS — K5090 Crohn's disease, unspecified, without complications: Secondary | ICD-10-CM

## 2016-05-10 ENCOUNTER — Ambulatory Visit: Payer: No Typology Code available for payment source

## 2016-05-18 ENCOUNTER — Ambulatory Visit: Payer: No Typology Code available for payment source | Attending: Gastroenterology

## 2016-05-18 DIAGNOSIS — K5090 Crohn's disease, unspecified, without complications: Secondary | ICD-10-CM

## 2016-05-18 DIAGNOSIS — R109 Unspecified abdominal pain: Secondary | ICD-10-CM | POA: Insufficient documentation

## 2016-05-18 DIAGNOSIS — R110 Nausea: Secondary | ICD-10-CM | POA: Insufficient documentation

## 2016-05-18 DIAGNOSIS — R634 Abnormal weight loss: Secondary | ICD-10-CM | POA: Insufficient documentation

## 2016-05-19 MED ORDER — GADOBUTROL 1 MMOL/ML IV SOLN
10.0000 mL | Freq: Once | INTRAVENOUS | Status: AC | PRN
Start: 2016-05-19 — End: 2016-05-18
  Administered 2016-05-18: 10 mmol via INTRAVENOUS
  Filled 2016-05-19: qty 10

## 2016-05-19 MED ORDER — GLUCAGON 1 MG IJ SOLR (WRAP)
1.0000 mg | Freq: Once | INTRAMUSCULAR | Status: AC
Start: 2016-05-18 — End: ?

## 2016-05-19 MED ORDER — BREEZA FLAVORED BEVERAGE
1500.0000 mL | Freq: Once | ORAL | Status: AC | PRN
Start: 2016-05-19 — End: 2016-05-18
  Administered 2016-05-18: 1500 mL via ORAL
  Filled 2016-05-19: qty 1500
